# Patient Record
Sex: Female | Born: 1940 | Race: White | Hispanic: No | Marital: Married | Smoking: Never smoker
Health system: Southern US, Community
[De-identification: ages and names within clinical notes are randomized; demographics above are authoritative.]

## PROBLEM LIST (undated history)

## (undated) DIAGNOSIS — E119 Type 2 diabetes mellitus without complications: Secondary | ICD-10-CM

## (undated) HISTORY — PX: OTHER SURGICAL HISTORY: SHX169

## (undated) HISTORY — PX: ABDOMINAL SURGERY: SHX537

## (undated) HISTORY — PX: ABDOMINAL HYSTERECTOMY: SHX81

## (undated) HISTORY — PX: TONSILLECTOMY: SUR1361

---

## 2001-01-19 ENCOUNTER — Other Ambulatory Visit: Admission: RE | Admit: 2001-01-19 | Discharge: 2001-01-19 | Payer: Self-pay | Admitting: Internal Medicine

## 2002-01-22 ENCOUNTER — Encounter: Payer: Self-pay | Admitting: Internal Medicine

## 2002-01-22 ENCOUNTER — Ambulatory Visit (HOSPITAL_COMMUNITY): Admission: RE | Admit: 2002-01-22 | Discharge: 2002-01-22 | Payer: Self-pay | Admitting: Internal Medicine

## 2003-04-08 ENCOUNTER — Encounter: Payer: Self-pay | Admitting: Internal Medicine

## 2003-04-08 ENCOUNTER — Ambulatory Visit (HOSPITAL_COMMUNITY): Admission: RE | Admit: 2003-04-08 | Discharge: 2003-04-08 | Payer: Self-pay | Admitting: Internal Medicine

## 2004-03-30 ENCOUNTER — Other Ambulatory Visit: Admission: RE | Admit: 2004-03-30 | Discharge: 2004-03-30 | Payer: Self-pay | Admitting: Internal Medicine

## 2004-05-17 ENCOUNTER — Ambulatory Visit (HOSPITAL_COMMUNITY): Admission: RE | Admit: 2004-05-17 | Discharge: 2004-05-17 | Payer: Self-pay | Admitting: Internal Medicine

## 2005-06-13 ENCOUNTER — Ambulatory Visit (HOSPITAL_COMMUNITY): Admission: RE | Admit: 2005-06-13 | Discharge: 2005-06-13 | Payer: Self-pay | Admitting: Internal Medicine

## 2006-07-14 ENCOUNTER — Ambulatory Visit (HOSPITAL_COMMUNITY): Admission: RE | Admit: 2006-07-14 | Discharge: 2006-07-14 | Payer: Self-pay | Admitting: Internal Medicine

## 2007-07-20 ENCOUNTER — Ambulatory Visit (HOSPITAL_COMMUNITY): Admission: RE | Admit: 2007-07-20 | Discharge: 2007-07-20 | Payer: Self-pay | Admitting: Internal Medicine

## 2008-09-13 ENCOUNTER — Ambulatory Visit (HOSPITAL_COMMUNITY): Admission: RE | Admit: 2008-09-13 | Discharge: 2008-09-13 | Payer: Self-pay | Admitting: Internal Medicine

## 2009-08-19 ENCOUNTER — Emergency Department (HOSPITAL_COMMUNITY): Admission: EM | Admit: 2009-08-19 | Discharge: 2009-08-19 | Payer: Self-pay | Admitting: Emergency Medicine

## 2009-09-22 ENCOUNTER — Ambulatory Visit (HOSPITAL_COMMUNITY): Admission: RE | Admit: 2009-09-22 | Discharge: 2009-09-22 | Payer: Self-pay | Admitting: Internal Medicine

## 2010-10-01 ENCOUNTER — Other Ambulatory Visit (HOSPITAL_COMMUNITY): Payer: Self-pay | Admitting: Nurse Practitioner

## 2010-10-01 DIAGNOSIS — Z139 Encounter for screening, unspecified: Secondary | ICD-10-CM

## 2010-10-04 ENCOUNTER — Ambulatory Visit (HOSPITAL_COMMUNITY)
Admission: RE | Admit: 2010-10-04 | Discharge: 2010-10-04 | Disposition: A | Discharge status: Home / Self Care | Payer: BC Managed Care – PPO | Source: Ambulatory Visit | Attending: Nurse Practitioner | Admitting: Nurse Practitioner

## 2010-10-04 DIAGNOSIS — Z139 Encounter for screening, unspecified: Secondary | ICD-10-CM

## 2010-10-04 DIAGNOSIS — Z1231 Encounter for screening mammogram for malignant neoplasm of breast: Secondary | ICD-10-CM | POA: Insufficient documentation

## 2010-10-10 ENCOUNTER — Other Ambulatory Visit: Payer: Self-pay | Admitting: Nurse Practitioner

## 2010-10-10 DIAGNOSIS — R928 Other abnormal and inconclusive findings on diagnostic imaging of breast: Secondary | ICD-10-CM

## 2010-10-14 LAB — BASIC METABOLIC PANEL
BUN: 12 mg/dL (ref 6–23)
CO2: 29 mEq/L (ref 19–32)
Calcium: 9.2 mg/dL (ref 8.4–10.5)
Chloride: 101 mEq/L (ref 96–112)
Creatinine, Ser: 0.59 mg/dL (ref 0.4–1.2)
GFR calc Af Amer: >60 mL/min (ref >60)
GFR calc non Af Amer: >60 mL/min (ref >60)
Glucose, Bld: 207 mg/dL — ABNORMAL HIGH (ref 70–99)
Potassium: 3.6 mEq/L (ref 3.5–5.1)
Sodium: 136 mEq/L (ref 135–145)

## 2010-10-14 LAB — POCT CARDIAC MARKERS
CKMB, poc: <1 ng/mL — ABNORMAL LOW (ref 1.0–8.0)
CKMB, poc: <1 ng/mL — ABNORMAL LOW (ref 1.0–8.0)
Myoglobin, poc: 41.8 ng/mL (ref 12–200)
Myoglobin, poc: 56.6 ng/mL (ref 12–200)
Troponin i, poc: <0.05 ng/mL (ref 0.00–0.09)
Troponin i, poc: <0.05 ng/mL (ref 0.00–0.09)

## 2010-10-14 LAB — CBC
HCT: 40.7 % (ref 36.0–46.0)
Hemoglobin: 13.7 g/dL (ref 12.0–15.0)
MCHC: 33.7 g/dL (ref 30.0–36.0)
MCV: 88.8 fL (ref 78.0–100.0)
Platelets: 237 10*3/uL (ref 150–400)
RBC: 4.58 MIL/uL (ref 3.87–5.11)
RDW: 13.5 % (ref 11.5–15.5)
WBC: 12.4 10*3/uL — ABNORMAL HIGH (ref 4.0–10.5)

## 2010-10-14 LAB — DIFFERENTIAL
Basophils Absolute: 0 10*3/uL (ref 0.0–0.1)
Basophils Relative: 0 % (ref 0–1)
Eosinophils Absolute: 0.1 10*3/uL (ref 0.0–0.7)
Eosinophils Relative: 0 % (ref 0–5)
Lymphocytes Relative: 9 % — ABNORMAL LOW (ref 12–46)
Lymphs Abs: 1.1 10*3/uL (ref 0.7–4.0)
Monocytes Absolute: 1.3 10*3/uL — ABNORMAL HIGH (ref 0.1–1.0)
Monocytes Relative: 10 % (ref 3–12)
Neutro Abs: 9.9 10*3/uL — ABNORMAL HIGH (ref 1.7–7.7)
Neutrophils Relative %: 80 % — ABNORMAL HIGH (ref 43–77)

## 2010-10-14 LAB — D-DIMER, QUANTITATIVE: D-Dimer, Quant: 0.45 ug/mL-FEU (ref 0.00–0.48)

## 2010-10-31 ENCOUNTER — Other Ambulatory Visit: Payer: Self-pay | Admitting: Nurse Practitioner

## 2010-10-31 ENCOUNTER — Ambulatory Visit (HOSPITAL_COMMUNITY)
Admission: RE | Admit: 2010-10-31 | Discharge: 2010-10-31 | Disposition: A | Discharge status: Home / Self Care | Payer: BC Managed Care – PPO | Source: Ambulatory Visit | Attending: Nurse Practitioner | Admitting: Nurse Practitioner

## 2010-10-31 DIAGNOSIS — R928 Other abnormal and inconclusive findings on diagnostic imaging of breast: Secondary | ICD-10-CM

## 2010-10-31 DIAGNOSIS — N63 Unspecified lump in unspecified breast: Secondary | ICD-10-CM | POA: Insufficient documentation

## 2010-10-31 DIAGNOSIS — N6009 Solitary cyst of unspecified breast: Secondary | ICD-10-CM | POA: Insufficient documentation

## 2011-07-20 ENCOUNTER — Emergency Department (HOSPITAL_COMMUNITY): Payer: BC Managed Care – PPO

## 2011-07-20 ENCOUNTER — Emergency Department (HOSPITAL_COMMUNITY)
Admission: EM | Admit: 2011-07-20 | Discharge: 2011-07-20 | Disposition: A | Discharge status: Home / Self Care | Payer: BC Managed Care – PPO | Attending: Emergency Medicine | Admitting: Emergency Medicine

## 2011-07-20 ENCOUNTER — Encounter: Payer: Self-pay | Admitting: *Deleted

## 2011-07-20 DIAGNOSIS — D72829 Elevated white blood cell count, unspecified: Secondary | ICD-10-CM

## 2011-07-20 DIAGNOSIS — Z9079 Acquired absence of other genital organ(s): Secondary | ICD-10-CM | POA: Insufficient documentation

## 2011-07-20 DIAGNOSIS — J189 Pneumonia, unspecified organism: Secondary | ICD-10-CM | POA: Insufficient documentation

## 2011-07-20 DIAGNOSIS — R7309 Other abnormal glucose: Secondary | ICD-10-CM | POA: Insufficient documentation

## 2011-07-20 LAB — BASIC METABOLIC PANEL
BUN: 11 mg/dL (ref 6–23)
CO2: 27 mEq/L (ref 19–32)
Calcium: 9.9 mg/dL (ref 8.4–10.5)
Chloride: 97 mEq/L (ref 96–112)
Creatinine, Ser: 0.42 mg/dL — ABNORMAL LOW (ref 0.50–1.10)
GFR calc Af Amer: >90 mL/min (ref >90)
GFR calc non Af Amer: >90 mL/min (ref >90)
Glucose, Bld: 129 mg/dL — ABNORMAL HIGH (ref 70–99)
Potassium: 3.9 mEq/L (ref 3.5–5.1)
Sodium: 134 mEq/L — ABNORMAL LOW (ref 135–145)

## 2011-07-20 LAB — DIFFERENTIAL
Basophils Absolute: 0 10*3/uL (ref 0.0–0.1)
Basophils Relative: 0 % (ref 0–1)
Eosinophils Absolute: 0.1 10*3/uL (ref 0.0–0.7)
Eosinophils Relative: 0 % (ref 0–5)
Lymphocytes Relative: 7 % — ABNORMAL LOW (ref 12–46)
Lymphs Abs: 1.2 10*3/uL (ref 0.7–4.0)
Monocytes Absolute: 1.8 10*3/uL — ABNORMAL HIGH (ref 0.1–1.0)
Monocytes Relative: 10 % (ref 3–12)
Neutro Abs: 13.8 10*3/uL — ABNORMAL HIGH (ref 1.7–7.7)
Neutrophils Relative %: 82 % — ABNORMAL HIGH (ref 43–77)

## 2011-07-20 LAB — CBC
HCT: 36.2 % (ref 36.0–46.0)
Hemoglobin: 11.9 g/dL — ABNORMAL LOW (ref 12.0–15.0)
MCH: 29 pg (ref 26.0–34.0)
MCHC: 32.9 g/dL (ref 30.0–36.0)
MCV: 88.1 fL (ref 78.0–100.0)
Platelets: 381 10*3/uL (ref 150–400)
RBC: 4.11 MIL/uL (ref 3.87–5.11)
RDW: 12.9 % (ref 11.5–15.5)
WBC: 16.8 10*3/uL — ABNORMAL HIGH (ref 4.0–10.5)

## 2011-07-20 MED ORDER — BENZONATATE 100 MG PO CAPS: 100.0000 mg | ORAL_CAPSULE | Freq: Three times a day (TID) | ORAL | Status: AC

## 2011-07-20 MED ORDER — SODIUM CHLORIDE 0.9 % IV SOLN
INTRAVENOUS | Status: DC
  Administered 2011-07-20: 08:00:00 via INTRAVENOUS

## 2011-07-20 MED ORDER — AZITHROMYCIN 250 MG PO TABS: ORAL_TABLET | ORAL | Status: DC

## 2011-07-20 MED ORDER — AZITHROMYCIN 250 MG PO TABS
500.0000 mg | ORAL_TABLET | Freq: Once | ORAL | Status: AC
  Administered 2011-07-20: 500 mg via ORAL
  Filled 2011-07-20: qty 2

## 2011-07-20 NOTE — ED Provider Notes (Signed)
History   This chart was scribed for Nicholes Stairs, MD by Sofie Rower. The patient was seen in room APA07/APA07 and the patient's care was started at 8:00 a.m.    CSN: [id number]  Arrival date & time 07/20/11  7829   First MD Initiated Contact with Patient 07/20/11 [phone]      Chief Complaint  Patient presents with  . Cough    (Consider location/radiation/quality/duration/timing/severity/associated sxs/prior treatment) HPI  Christine Mcfarland is a 70 y.o. female who presents to the Emergency Department complaining of moderate, constant chest pain onset three weeks ago with associated symptoms of intermittent chills, sweats, loss of energy, neck pain, back pain, productive yellow cough. Pt denies fever, nausea, vomiting, diarrhea. Pt. Has a history of diabetes. Pt PCP is Dr. Rico Ala in [place].   History reviewed. No pertinent past medical history.  Past Surgical History  Procedure Date  . Tonsillectomy   . Bilateral tubal ligagtion   . Abdominal hysterectomy   . Abdominal surgery     History reviewed. No pertinent family history.  History  Substance Use Topics  . Smoking status: Never Smoker   . Smokeless tobacco: Not on file  . Alcohol Use: No    OB History    Grav Para Term Preterm Abortions TAB SAB Ect Mult Living                  Review of Systems  10 Systems reviewed and are negative for acute change except as noted in the HPI.   Allergies  Sulfa antibiotics  Home Medications  No current outpatient prescriptions on file.  BP 121/59  Pulse 106  Temp(Src) 98.1 F (36.7 C) (Oral)  Resp 18  Ht 5\' 1"  (1.549 m)  Wt 110 lb (49.896 kg)  BMI 20.78 kg/m2  SpO2 96%  Physical Exam  Nursing note and vitals reviewed. Constitutional: She is oriented to person, place, and time. She appears well-developed and well-nourished. No distress.  HENT:  Head: Normocephalic and atraumatic.  Mouth/Throat: Oropharynx is clear and moist.  Eyes: EOM are normal. Pupils  are equal, round, and reactive to light.  Neck: Neck supple. No tracheal deviation present.  Cardiovascular: Normal rate and regular rhythm.   No murmur heard. Pulmonary/Chest: Effort normal and breath sounds normal. No respiratory distress. She has no wheezes.  Abdominal: Soft. She exhibits no distension.  Musculoskeletal: Normal range of motion. She exhibits no edema.  Neurological: She is alert and oriented to person, place, and time. No sensory deficit.  Skin: Skin is warm and dry.  Psychiatric: She has a normal mood and affect. Her behavior is normal.    ED Course  Procedures (including critical care time) The patient is a 70 year old, female, with borderline diabetes, does not smoke.  She presents to emergency room [facility] with a 3 weeks of symptoms consisting of chest congestion, productive cough with yellow sputum, intermittent, chills and sweating, but no fever.  Her physical examination is normal.  We will perform a chest x-ray, and laboratory testing, to try to determine whether she has a pneumonia or other etiology for her productive cough, and weakness.  DIAGNOSTIC STUDIES: Oxygen Saturation is 96% on room air, adequate by my interpretation.    COORDINATION OF CARE:  Results for orders placed during the hospital encounter of 07/20/11  CBC      Component Value Range   WBC 16.8 (*) 4.0 - 10.5 (K/uL)   RBC 4.11  3.87 - 5.11 (MIL/uL)   Hemoglobin 11.9 (*)  12.0 - 15.0 (g/dL)   HCT 40.9  81.1 - 91.4 (%)   MCV 88.1  78.0 - 100.0 (fL)   MCH 29.0  26.0 - 34.0 (pg)   MCHC 32.9  30.0 - 36.0 (g/dL)   RDW 78.2  95.6 - 21.3 (%)   Platelets 381  150 - 400 (K/uL)  DIFFERENTIAL      Component Value Range   Neutrophils Relative 82 (*) 43 - 77 (%)   Neutro Abs 13.8 (*) 1.7 - 7.7 (K/uL)   Lymphocytes Relative 7 (*) 12 - 46 (%)   Lymphs Abs 1.2  0.7 - 4.0 (K/uL)   Monocytes Relative 10  3 - 12 (%)   Monocytes Absolute 1.8 (*) 0.1 - 1.0 (K/uL)   Eosinophils Relative 0  0 - 5 (%)    Eosinophils Absolute 0.1  0.0 - 0.7 (K/uL)   Basophils Relative 0  0 - 1 (%)   Basophils Absolute 0.0  0.0 - 0.1 (K/uL)  BASIC METABOLIC PANEL      Component Value Range   Sodium 134 (*) 135 - 145 (mEq/L)   Potassium 3.9  3.5 - 5.1 (mEq/L)   Chloride 97  96 - 112 (mEq/L)   CO2 27  19 - 32 (mEq/L)   Glucose, Bld 129 (*) 70 - 99 (mg/dL)   BUN 11  6 - 23 (mg/dL)   Creatinine, Ser 0.86 (*) 0.50 - 1.10 (mg/dL)   Calcium 9.9  8.4 - 57.8 (mg/dL)   GFR calc non Af Amer >90  >90 (mL/min)   GFR calc Af Amer >90  >90 (mL/min)   Dg Chest 2 View  07/20/2011  *RADIOLOGY REPORT*  Clinical Data: Cough and weakness  CHEST - 2 VIEW  Comparison: 08/19/2009  Findings: Mild hyperinflation.  Patchy subsegmental atelectasis versus developing infiltrates in the left lower lobe.  Right lung clear.  Heart size normal.  No effusion.  Regional bones unremarkable.  IMPRESSION:  1.  Patchy left lower lobe atelectasis or developing infiltrates.  Original Report Authenticated By: Osa Craver, M.D.       MDM  CAP No respiratory distress.  No toxicity.   She has community-acquired pneumonia, but her comorbidities.  Only include borderline diabetes mellitus.  She has no other significant comorbidities.  She does not smoke and does not use oxygen at home.  Therefore, her pneumonia.  Severity score is less than 70, and she can be treated as an outpatient.  8:02AM- EDP at bedside discusses treatment plan. 8:36AM- Recheck. EDP at bedside discusses treatment plan.    I personally performed the services described in this documentation, which was scribed in my presence. The recorded information has been reviewed and considered.    Nicholes Stairs, MD 07/20/11 [phone]

## 2011-07-20 NOTE — ED Notes (Signed)
Pt c/o cough, congestion and feeling bad x 3 weeks. States that she is coughing up yellow phlegm. Denies fever.

## 2011-09-10 IMAGING — US US BREAST*R*
1 series · 4 of 4 positions shown · non-contrast
Comparison: Mammograms 10/04/2010

CLINICAL DATA: Possible mass right breast identified on one-view
recent screening mammogram.

DIGITAL DIAGNOSTIC RIGHT MAMMOGRAM WITHOUT CAD AND RIGHT BREAST
ULTRASOUND:

[Series 1: us breast*right* · 0.07mm/px · 4 of 4 slices shown]
[im 1/4]
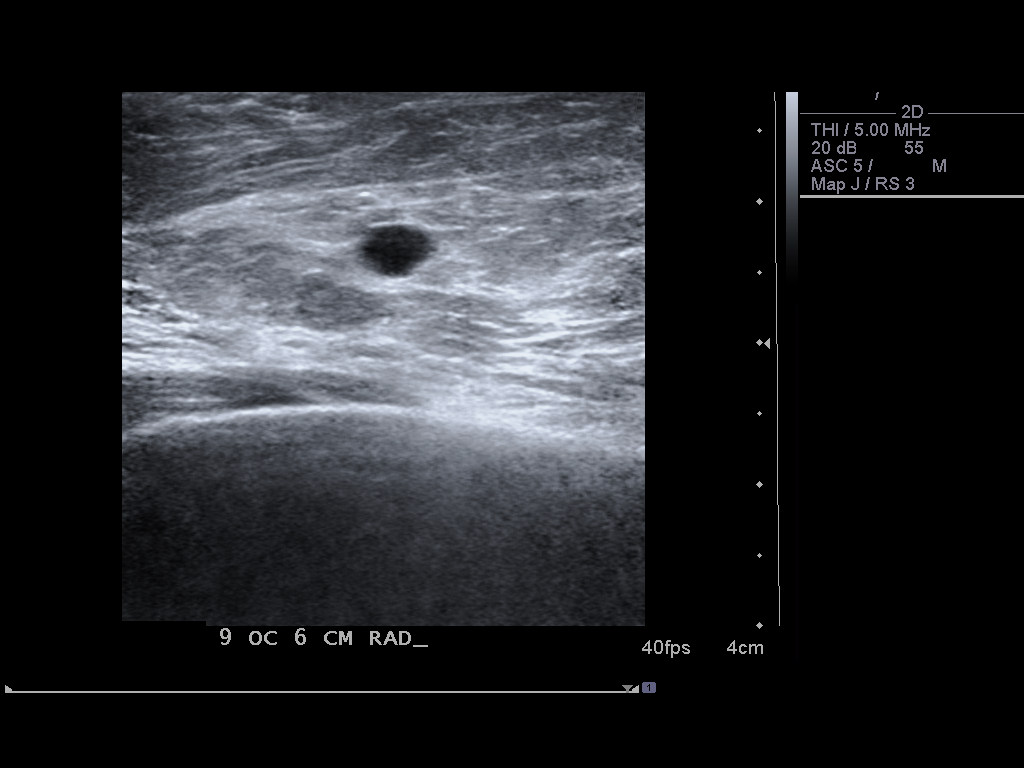
[im 2/4]
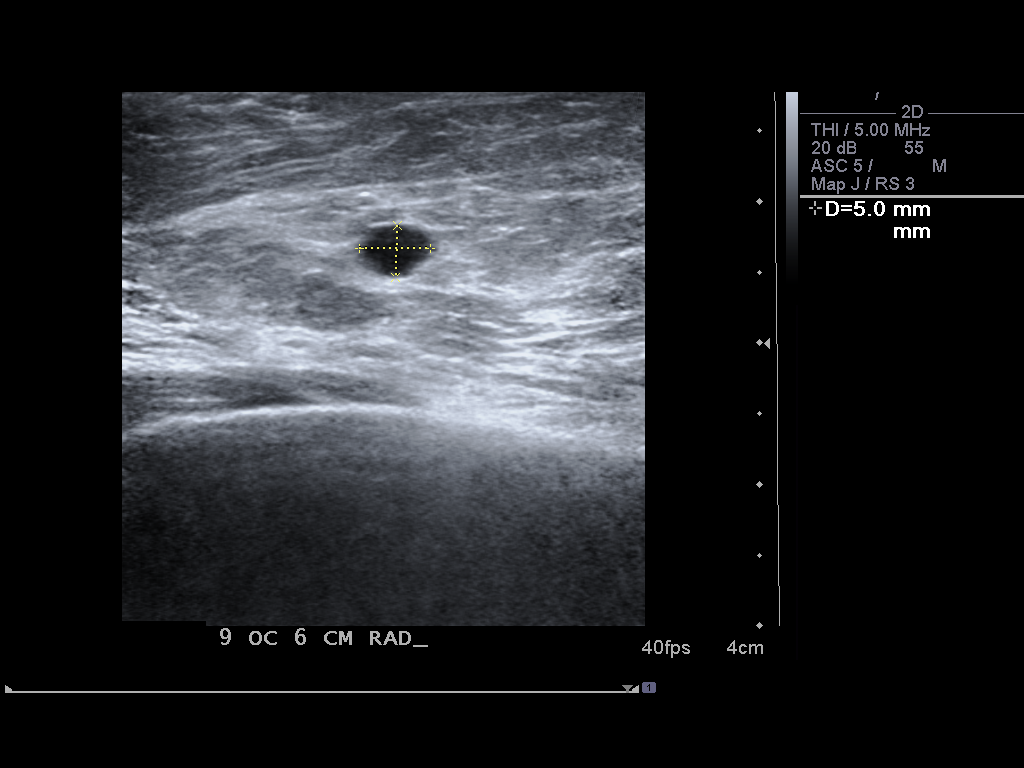
[im 3/4]
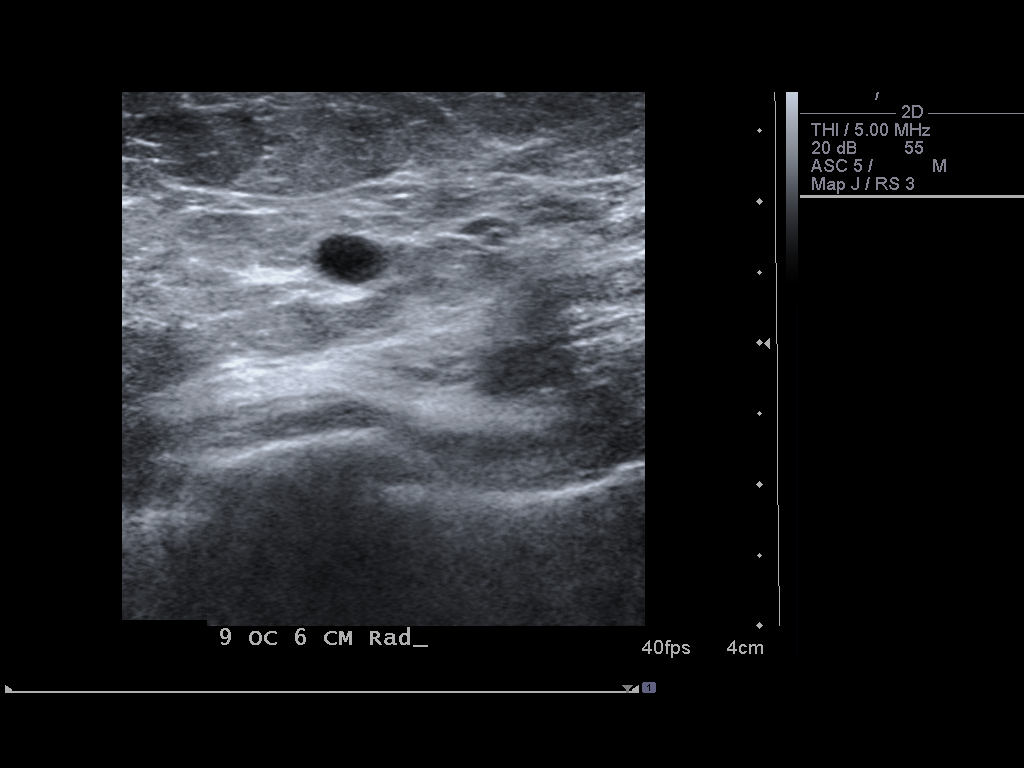
[im 4/4]
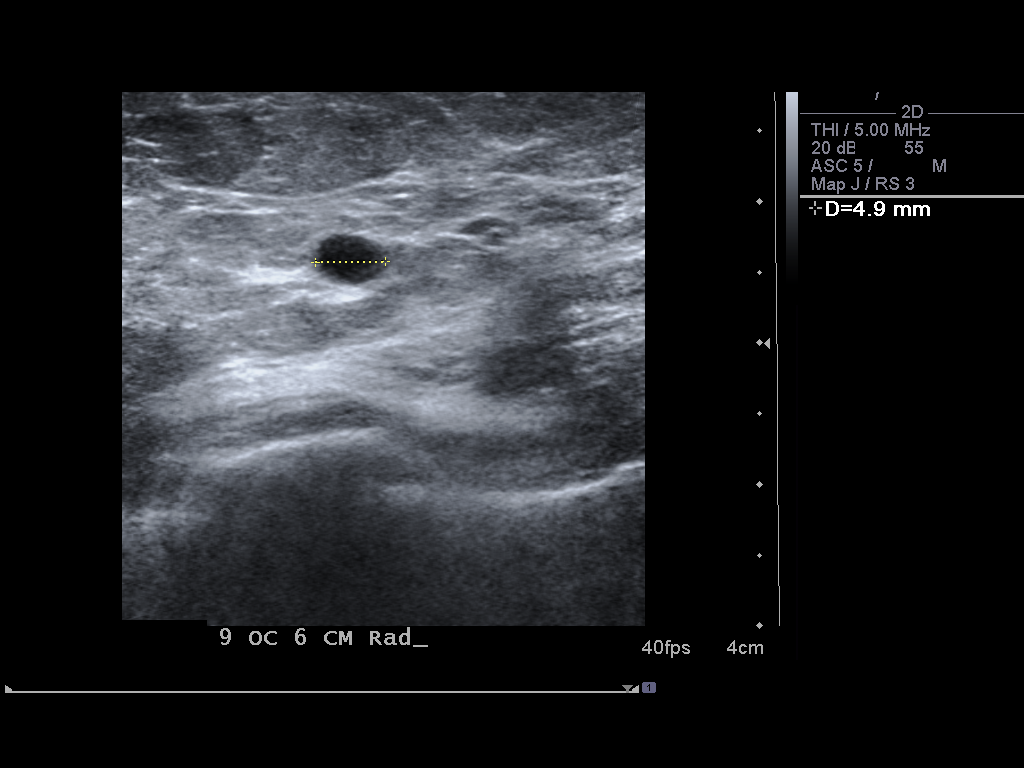

[4 of 4 positions shown; findings below may reference images not displayed]

FINDINGS: A focal spot compression view of the outer right breast
confirms a circumscribed low density sub-centimeter nodule.  On a
90 degrees lateral view of the right breast, this nodule is not
definitely visualized.  There are no suspicious findings on the 90
degrees lateral view.

On physical exam, no mass is palpated in the outer right breast.

Ultrasound is performed, showing a 5 x 5 x 4 mm simple cyst 9
o'clock position 6 cm from the nipple.  This corresponds in
expected size and shape to the nodular density seen on the CC view
of the mammogram.  No solid mass is identified in the outer right
breast.
IMPRESSION: No evidence of malignancy in the right breast.  5 mm simple cyst 9
o'clock position.  Bilateral screening mammogram in 1 year is
recommended.

BI-RADS CATEGORY 2:  Benign finding(s).

## 2011-10-30 ENCOUNTER — Other Ambulatory Visit (HOSPITAL_COMMUNITY): Payer: Self-pay | Admitting: Nurse Practitioner

## 2011-10-30 DIAGNOSIS — Z139 Encounter for screening, unspecified: Secondary | ICD-10-CM

## 2011-11-01 ENCOUNTER — Ambulatory Visit (HOSPITAL_COMMUNITY)
Admission: RE | Admit: 2011-11-01 | Discharge: 2011-11-01 | Disposition: A | Discharge status: Home / Self Care | Payer: BC Managed Care – PPO | Source: Ambulatory Visit | Attending: Nurse Practitioner | Admitting: Nurse Practitioner

## 2011-11-01 DIAGNOSIS — N6459 Other signs and symptoms in breast: Secondary | ICD-10-CM | POA: Insufficient documentation

## 2011-11-01 DIAGNOSIS — Z1231 Encounter for screening mammogram for malignant neoplasm of breast: Secondary | ICD-10-CM | POA: Insufficient documentation

## 2011-11-01 DIAGNOSIS — Z139 Encounter for screening, unspecified: Secondary | ICD-10-CM

## 2011-11-06 ENCOUNTER — Other Ambulatory Visit: Payer: Self-pay | Admitting: Nurse Practitioner

## 2011-11-06 DIAGNOSIS — R928 Other abnormal and inconclusive findings on diagnostic imaging of breast: Secondary | ICD-10-CM

## 2011-11-14 ENCOUNTER — Ambulatory Visit
Admission: RE | Admit: 2011-11-14 | Discharge: 2011-11-14 | Disposition: A | Discharge status: Home / Self Care | Payer: BC Managed Care – PPO | Source: Ambulatory Visit | Attending: Nurse Practitioner | Admitting: Nurse Practitioner

## 2011-11-14 DIAGNOSIS — R928 Other abnormal and inconclusive findings on diagnostic imaging of breast: Secondary | ICD-10-CM

## 2012-05-11 ENCOUNTER — Other Ambulatory Visit (HOSPITAL_COMMUNITY): Payer: Self-pay | Admitting: Nurse Practitioner

## 2012-05-11 DIAGNOSIS — Z09 Encounter for follow-up examination after completed treatment for conditions other than malignant neoplasm: Secondary | ICD-10-CM

## 2012-05-27 ENCOUNTER — Ambulatory Visit (HOSPITAL_COMMUNITY)
Admission: RE | Admit: 2012-05-27 | Discharge: 2012-05-27 | Disposition: A | Discharge status: Home / Self Care | Payer: BC Managed Care – PPO | Source: Ambulatory Visit | Attending: Nurse Practitioner | Admitting: Nurse Practitioner

## 2012-05-27 DIAGNOSIS — N6009 Solitary cyst of unspecified breast: Secondary | ICD-10-CM | POA: Insufficient documentation

## 2012-05-27 DIAGNOSIS — Z09 Encounter for follow-up examination after completed treatment for conditions other than malignant neoplasm: Secondary | ICD-10-CM | POA: Insufficient documentation

## 2012-11-02 ENCOUNTER — Other Ambulatory Visit (HOSPITAL_COMMUNITY): Payer: Self-pay | Admitting: Nurse Practitioner

## 2012-11-02 DIAGNOSIS — Z09 Encounter for follow-up examination after completed treatment for conditions other than malignant neoplasm: Secondary | ICD-10-CM

## 2012-11-17 ENCOUNTER — Ambulatory Visit (HOSPITAL_COMMUNITY)
Admission: RE | Admit: 2012-11-17 | Discharge: 2012-11-17 | Disposition: A | Discharge status: Home / Self Care | Payer: BC Managed Care – PPO | Source: Ambulatory Visit | Attending: Nurse Practitioner | Admitting: Nurse Practitioner

## 2012-11-17 DIAGNOSIS — Z1231 Encounter for screening mammogram for malignant neoplasm of breast: Secondary | ICD-10-CM | POA: Insufficient documentation

## 2012-11-17 DIAGNOSIS — Z09 Encounter for follow-up examination after completed treatment for conditions other than malignant neoplasm: Secondary | ICD-10-CM

## 2013-04-06 IMAGING — US US BREAST*L*
1 series · 4 of 4 positions shown · non-contrast
Comparison: 11/14/2011

On physical exam, no mass is palpated in the left breast.

CLINICAL DATA: The patient returns for short interval follow-up of
clustered cysts in the left upper inner quadrant noted on prior
study dated 11/14/2011.

LEFT BREAST ULTRASOUND

[Series 1: us breast*left* · 0.08mm/px · 4 of 4 slices shown]
[im 1/4]
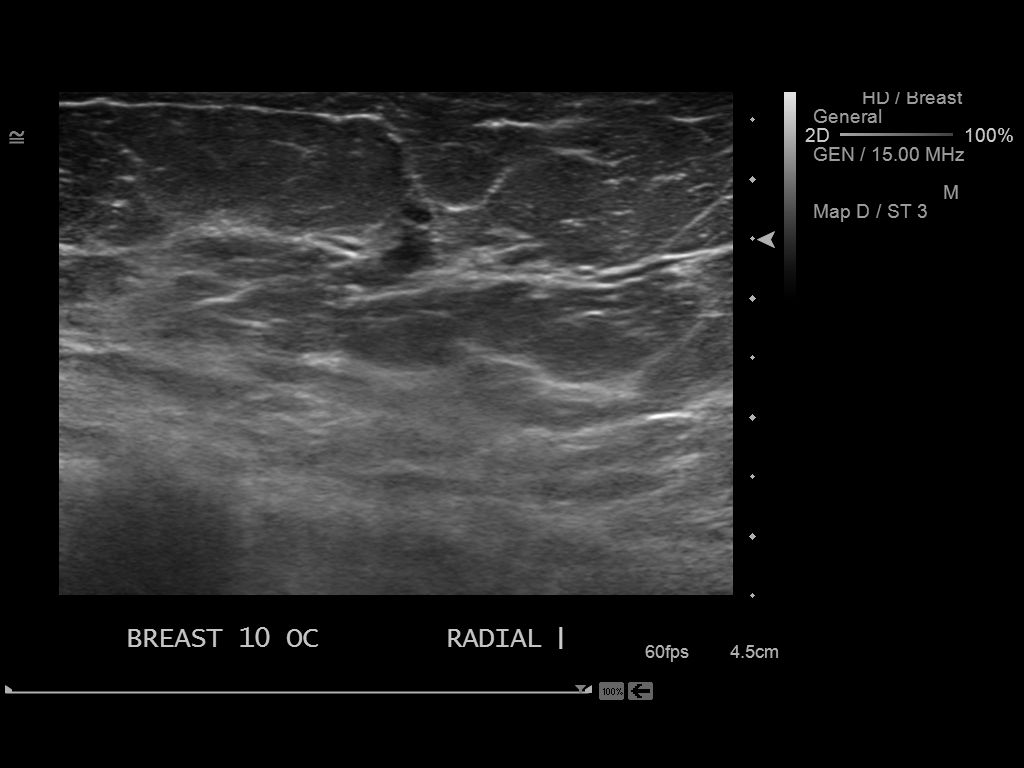
[im 2/4]
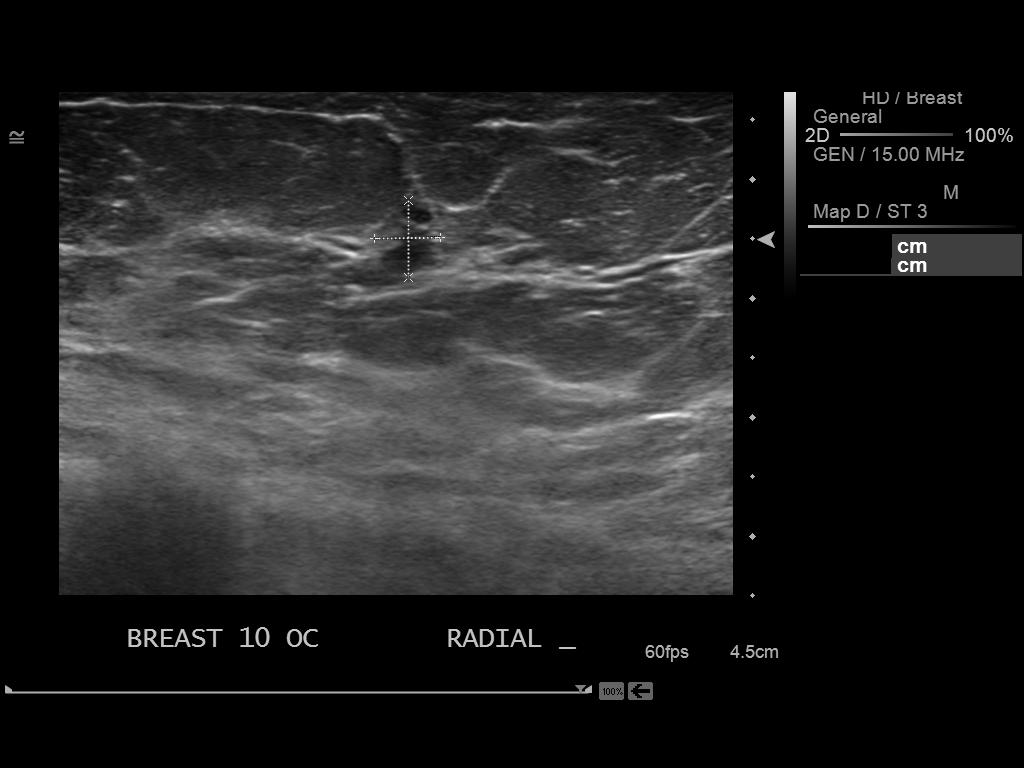
[im 3/4]
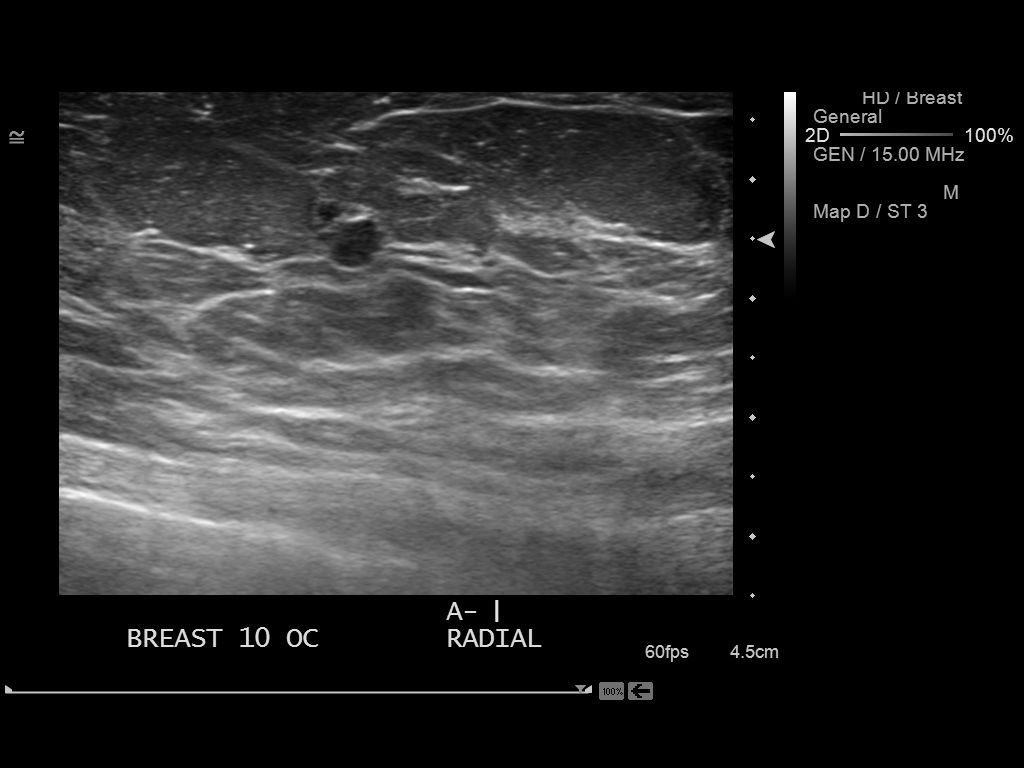
[im 4/4]
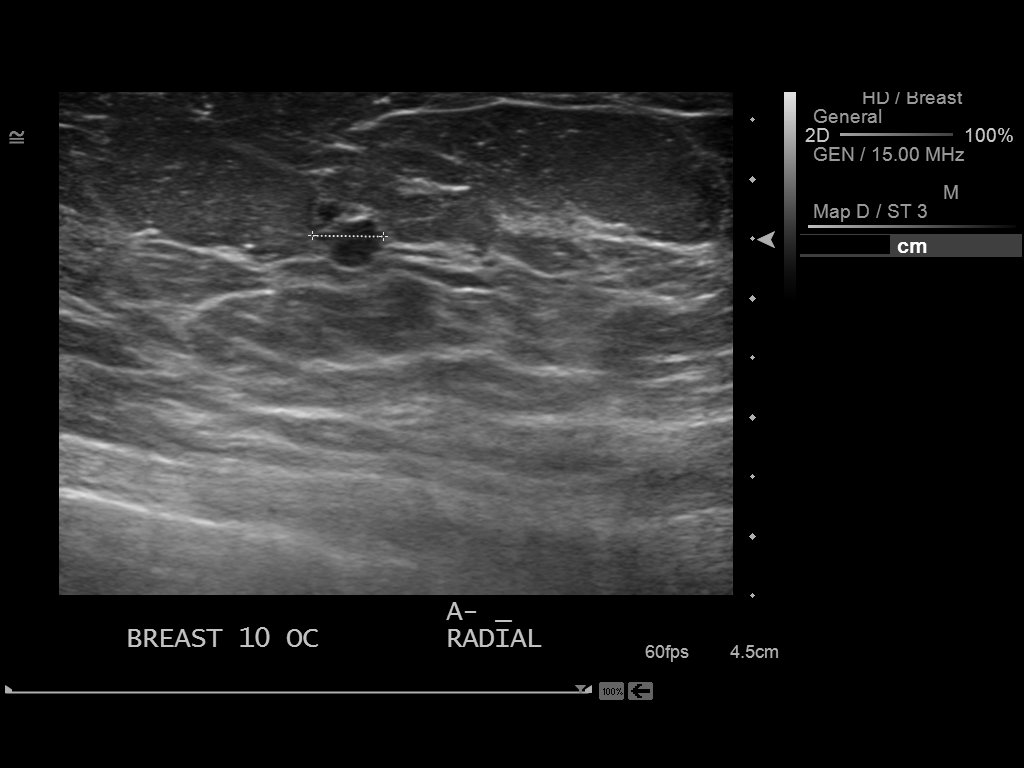

[4 of 4 positions shown; findings below may reference images not displayed]

FINDINGS: Ultrasound is performed, showing clustered cysts at 10
o'clock 4 cm from the left nipple with an overall measurement of 6
x 7 x 6 mm.
IMPRESSION: Benign clustered cysts in the left upper inner quadrant.

RECOMMENDATION:
Yearly screening mammography is suggested with next scheduled exam
in October 2012.

I have discussed the findings and recommendations with the patient.
Results were also provided in writing at the conclusion of the
visit.

BI-RADS CATEGORY 2:  Benign finding(s).

## 2013-10-19 ENCOUNTER — Other Ambulatory Visit (HOSPITAL_COMMUNITY): Payer: Self-pay | Admitting: Nurse Practitioner

## 2013-10-19 DIAGNOSIS — Z1231 Encounter for screening mammogram for malignant neoplasm of breast: Secondary | ICD-10-CM

## 2013-11-18 ENCOUNTER — Ambulatory Visit (HOSPITAL_COMMUNITY)
Admission: RE | Admit: 2013-11-18 | Discharge: 2013-11-18 | Disposition: A | Discharge status: Home / Self Care | Payer: Medicare Other | Source: Ambulatory Visit | Attending: Nurse Practitioner | Admitting: Nurse Practitioner

## 2013-11-18 DIAGNOSIS — Z1231 Encounter for screening mammogram for malignant neoplasm of breast: Secondary | ICD-10-CM | POA: Insufficient documentation

## 2015-08-02 ENCOUNTER — Other Ambulatory Visit (HOSPITAL_COMMUNITY): Payer: Self-pay | Admitting: Nurse Practitioner

## 2015-08-02 DIAGNOSIS — Z1231 Encounter for screening mammogram for malignant neoplasm of breast: Secondary | ICD-10-CM

## 2015-08-09 ENCOUNTER — Ambulatory Visit (HOSPITAL_COMMUNITY)
Admission: RE | Admit: 2015-08-09 | Discharge: 2015-08-09 | Disposition: A | Discharge status: Home / Self Care | Payer: Medicare Other | Source: Ambulatory Visit | Attending: Nurse Practitioner | Admitting: Nurse Practitioner

## 2015-08-09 DIAGNOSIS — Z1231 Encounter for screening mammogram for malignant neoplasm of breast: Secondary | ICD-10-CM | POA: Diagnosis present

## 2016-10-23 ENCOUNTER — Other Ambulatory Visit (HOSPITAL_COMMUNITY): Payer: Self-pay | Admitting: Nurse Practitioner

## 2016-10-23 DIAGNOSIS — Z1231 Encounter for screening mammogram for malignant neoplasm of breast: Secondary | ICD-10-CM

## 2016-11-06 ENCOUNTER — Ambulatory Visit (HOSPITAL_COMMUNITY)
Admission: RE | Admit: 2016-11-06 | Discharge: 2016-11-06 | Disposition: A | Discharge status: Home / Self Care | Payer: Medicare Other | Source: Ambulatory Visit | Attending: Nurse Practitioner | Admitting: Nurse Practitioner

## 2016-11-06 DIAGNOSIS — Z1231 Encounter for screening mammogram for malignant neoplasm of breast: Secondary | ICD-10-CM | POA: Diagnosis not present

## 2017-10-21 ENCOUNTER — Other Ambulatory Visit (HOSPITAL_COMMUNITY): Payer: Self-pay | Admitting: Family Medicine

## 2017-10-21 DIAGNOSIS — Z1231 Encounter for screening mammogram for malignant neoplasm of breast: Secondary | ICD-10-CM

## 2017-11-10 ENCOUNTER — Encounter (HOSPITAL_COMMUNITY): Payer: Self-pay

## 2017-11-10 ENCOUNTER — Ambulatory Visit (HOSPITAL_COMMUNITY)
Admission: RE | Admit: 2017-11-10 | Discharge: 2017-11-10 | Disposition: A | Discharge status: Home / Self Care | Payer: Medicare Other | Source: Ambulatory Visit | Attending: Family Medicine | Admitting: Family Medicine

## 2017-11-10 DIAGNOSIS — Z1231 Encounter for screening mammogram for malignant neoplasm of breast: Secondary | ICD-10-CM | POA: Diagnosis not present

## 2018-12-23 ENCOUNTER — Other Ambulatory Visit (HOSPITAL_COMMUNITY): Payer: Self-pay | Admitting: Family Medicine

## 2018-12-23 DIAGNOSIS — Z1231 Encounter for screening mammogram for malignant neoplasm of breast: Secondary | ICD-10-CM

## 2019-01-11 ENCOUNTER — Ambulatory Visit (HOSPITAL_COMMUNITY)
Admission: RE | Admit: 2019-01-11 | Discharge: 2019-01-11 | Disposition: A | Discharge status: Home / Self Care | Payer: Medicare Other | Source: Ambulatory Visit | Attending: Family Medicine | Admitting: Family Medicine

## 2019-01-11 ENCOUNTER — Other Ambulatory Visit: Payer: Self-pay

## 2019-01-11 DIAGNOSIS — Z1231 Encounter for screening mammogram for malignant neoplasm of breast: Secondary | ICD-10-CM | POA: Diagnosis not present

## 2019-12-07 ENCOUNTER — Other Ambulatory Visit (HOSPITAL_COMMUNITY): Payer: Self-pay | Admitting: Family Medicine

## 2019-12-07 DIAGNOSIS — N644 Mastodynia: Secondary | ICD-10-CM

## 2019-12-07 DIAGNOSIS — Z1231 Encounter for screening mammogram for malignant neoplasm of breast: Secondary | ICD-10-CM

## 2020-01-13 ENCOUNTER — Other Ambulatory Visit: Payer: Self-pay

## 2020-01-13 ENCOUNTER — Ambulatory Visit (HOSPITAL_COMMUNITY)
Admission: RE | Admit: 2020-01-13 | Discharge: 2020-01-13 | Disposition: A | Discharge status: Home / Self Care | Payer: Medicare Other | Source: Ambulatory Visit | Attending: Family Medicine | Admitting: Family Medicine

## 2020-01-13 DIAGNOSIS — Z1231 Encounter for screening mammogram for malignant neoplasm of breast: Secondary | ICD-10-CM | POA: Diagnosis not present

## 2020-04-12 ENCOUNTER — Encounter (HOSPITAL_COMMUNITY): Payer: Self-pay

## 2020-04-12 ENCOUNTER — Ambulatory Visit (INDEPENDENT_AMBULATORY_CARE_PROVIDER_SITE_OTHER): Payer: Medicare Other

## 2020-04-12 ENCOUNTER — Emergency Department (HOSPITAL_COMMUNITY)
Admission: EM | Admit: 2020-04-12 | Discharge: 2020-04-12 | Disposition: A | Discharge status: Home / Self Care | Payer: Medicare Other | Attending: Emergency Medicine | Admitting: Emergency Medicine

## 2020-04-12 ENCOUNTER — Other Ambulatory Visit: Payer: Self-pay

## 2020-04-12 ENCOUNTER — Emergency Department (HOSPITAL_COMMUNITY): Payer: Medicare Other

## 2020-04-12 ENCOUNTER — Ambulatory Visit
Admission: EM | Admit: 2020-04-12 | Discharge: 2020-04-12 | Disposition: A | Discharge status: Home / Self Care | Payer: Medicare Other | Source: Home / Self Care

## 2020-04-12 DIAGNOSIS — E119 Type 2 diabetes mellitus without complications: Secondary | ICD-10-CM | POA: Insufficient documentation

## 2020-04-12 DIAGNOSIS — N3000 Acute cystitis without hematuria: Secondary | ICD-10-CM | POA: Insufficient documentation

## 2020-04-12 DIAGNOSIS — R109 Unspecified abdominal pain: Secondary | ICD-10-CM | POA: Diagnosis not present

## 2020-04-12 DIAGNOSIS — R1084 Generalized abdominal pain: Secondary | ICD-10-CM | POA: Insufficient documentation

## 2020-04-12 DIAGNOSIS — R1030 Lower abdominal pain, unspecified: Secondary | ICD-10-CM | POA: Diagnosis not present

## 2020-04-12 HISTORY — DX: Type 2 diabetes mellitus without complications: E11.9

## 2020-04-12 LAB — URINALYSIS, ROUTINE W REFLEX MICROSCOPIC
Bilirubin Urine: NEGATIVE
Glucose, UA: NEGATIVE mg/dL
Hgb urine dipstick: NEGATIVE
Ketones, ur: NEGATIVE mg/dL
Nitrite: NEGATIVE
Protein, ur: NEGATIVE mg/dL
Specific Gravity, Urine: 1.018 (ref 1.005–1.030)
pH: 6 (ref 5.0–8.0)

## 2020-04-12 LAB — COMPREHENSIVE METABOLIC PANEL
ALT: 23 U/L (ref 0–44)
AST: 21 U/L (ref 15–41)
Albumin: 4 g/dL (ref 3.5–5.0)
Alkaline Phosphatase: 51 U/L (ref 38–126)
Anion gap: 9 (ref 5–15)
BUN: 14 mg/dL (ref 8–23)
CO2: 25 mmol/L (ref 22–32)
Calcium: 9 mg/dL (ref 8.9–10.3)
Chloride: 94 mmol/L — ABNORMAL LOW (ref 98–111)
Creatinine, Ser: 0.53 mg/dL (ref 0.44–1.00)
GFR calc Af Amer: >60 mL/min (ref >60)
GFR calc non Af Amer: >60 mL/min (ref >60)
Glucose, Bld: 149 mg/dL — ABNORMAL HIGH (ref 70–99)
Potassium: 4 mmol/L (ref 3.5–5.1)
Sodium: 128 mmol/L — ABNORMAL LOW (ref 135–145)
Total Bilirubin: 0.7 mg/dL (ref 0.3–1.2)
Total Protein: 6.8 g/dL (ref 6.5–8.1)

## 2020-04-12 LAB — POCT URINALYSIS DIP (MANUAL ENTRY)
Glucose, UA: NEGATIVE mg/dL
Nitrite, UA: NEGATIVE
Protein Ur, POC: 30 mg/dL — AB
Spec Grav, UA: 1.015 (ref 1.010–1.025)
Urobilinogen, UA: 0.2 E.U./dL
pH, UA: 8.5 — AB (ref 5.0–8.0)

## 2020-04-12 LAB — CBC
HCT: 38.3 % (ref 36.0–46.0)
Hemoglobin: 12.3 g/dL (ref 12.0–15.0)
MCH: 29.4 pg (ref 26.0–34.0)
MCHC: 32.1 g/dL (ref 30.0–36.0)
MCV: 91.4 fL (ref 80.0–100.0)
Platelets: 301 10*3/uL (ref 150–400)
RBC: 4.19 MIL/uL (ref 3.87–5.11)
RDW: 13.7 % (ref 11.5–15.5)
WBC: 21.2 10*3/uL — ABNORMAL HIGH (ref 4.0–10.5)
nRBC: 0 % (ref 0.0–0.2)

## 2020-04-12 LAB — LIPASE, BLOOD: Lipase: 23 U/L (ref 11–51)

## 2020-04-12 MED ORDER — IOHEXOL 300 MG/ML  SOLN
100.0000 mL | Freq: Once | INTRAMUSCULAR | Status: AC | PRN
  Administered 2020-04-12: 100 mL via INTRAVENOUS

## 2020-04-12 MED ORDER — CEPHALEXIN 500 MG PO CAPS: 500.0000 mg | ORAL_CAPSULE | Freq: Four times a day (QID) | ORAL | 0 refills | Status: AC

## 2020-04-12 MED ORDER — SODIUM CHLORIDE 0.9 % IV SOLN
1.0000 g | Freq: Once | INTRAVENOUS | Status: AC
  Administered 2020-04-12: 1 g via INTRAVENOUS
  Filled 2020-04-12: qty 10

## 2020-04-12 NOTE — Discharge Instructions (Signed)

## 2020-04-12 NOTE — ED Provider Notes (Signed)
Emergency Department Provider Note   I have reviewed the triage vital signs and the nursing notes.   HISTORY  Chief Complaint Abdominal Pain   HPI Christine Mcfarland is a 79 y.o. female with past medical history of diabetes presents emergency department evaluation of lower abdominal pain.  Symptoms began last night and became significantly worse overnight and through the day.  She notes pain with touching especially in the lower abdomen with movement.  She has had some urine hesitancy but denies dysuria or frequency.  No fevers or chills.  No diarrhea or vomiting.  She notes a prior history of hysterectomy and additional abdominal surgery for lysis of adhesions.  She believes that her appendix and gallbladder have been removed but is not completely sure.  Denies any vaginal bleeding or discharge.  She was initially seen in urgent care and referred to the emergency department for further evaluation. No modifying factors.    Past Medical History:  Diagnosis Date  . Diabetes mellitus without complication (HCC)     There are no problems to display for this patient.   Past Surgical History:  Procedure Laterality Date  . ABDOMINAL HYSTERECTOMY    . ABDOMINAL SURGERY    . bilateral tubal ligagtion    . TONSILLECTOMY      Allergies Sulfa antibiotics  History reviewed. No pertinent family history.  Social History Social History   Tobacco Use  . Smoking status: Never Smoker  . Smokeless tobacco: Never Used  Substance Use Topics  . Alcohol use: No  . Drug use: No    Review of Systems  Constitutional: No fever/chills Eyes: No visual changes. ENT: No sore throat. Cardiovascular: Denies chest pain. Respiratory: Denies shortness of breath. Gastrointestinal: Positive abdominal pain.  No nausea, no vomiting.  No diarrhea.  No constipation. Genitourinary: Negative for dysuria. Positive urine hesitancy.  Musculoskeletal: Negative for back pain. Skin: Negative for  rash. Neurological: Negative for headaches, focal weakness or numbness.  10-point ROS otherwise negative.  ____________________________________________   PHYSICAL EXAM:  VITAL SIGNS: ED Triage Vitals  Enc Vitals Group     BP 04/12/20 1550 (!) 124/51     Pulse Rate 04/12/20 1550 81     Resp 04/12/20 1550 16     Temp 04/12/20 1550 98.1 F (36.7 C)     Temp Source 04/12/20 1550 Oral     SpO2 04/12/20 1550 100 %     Weight 04/12/20 1551 120 lb (54.4 kg)     Height 04/12/20 1551 5\' 2"  (1.575 m)   Constitutional: Alert and oriented. Well appearing and in no acute distress. Eyes: Conjunctivae are normal. Head: Atraumatic. Nose: No congestion/rhinnorhea. Mouth/Throat: Mucous membranes are moist.   Neck: No stridor.  Cardiovascular: Normal rate, regular rhythm. Good peripheral circulation. Grossly normal heart sounds.   Respiratory: Normal respiratory effort.  No retractions. Lungs CTAB. Gastrointestinal: Soft with diffuse tenderness to palpation worse in the lower abdomen diffusely. No rebound or guarding. No distention.  Musculoskeletal: No gross deformities of extremities. Neurologic:  Normal speech and language.  Skin:  Skin is warm, dry and intact. No rash noted.   ____________________________________________   LABS (all labs ordered are listed, but only abnormal results are displayed)  Labs Reviewed  COMPREHENSIVE METABOLIC PANEL - Abnormal; Notable for the following components:      Result Value   Sodium 128 (*)    Chloride 94 (*)    Glucose, Bld 149 (*)    All other components within normal limits  CBC - Abnormal; Notable for the following components:   WBC 21.2 (*)    All other components within normal limits  URINALYSIS, ROUTINE W REFLEX MICROSCOPIC - Abnormal; Notable for the following components:   Color, Urine AMBER (*)    APPearance HAZY (*)    Leukocytes,Ua LARGE (*)    Bacteria, UA RARE (*)    All other components within normal limits  LIPASE, BLOOD    ____________________________________________  RADIOLOGY  DG Abd 2 Views  Result Date: 04/12/2020 CLINICAL DATA:  Abdominal pain EXAM: X-RAY ABDOMEN 2 VIEWS COMPARISON:  None. FINDINGS: Supine and upright images were obtained. There is moderate stool throughout the colon. There is no bowel dilatation or air-fluid level to suggest bowel obstruction. No free air. There are surgical clips in the right quadrant. Lung bases are clear. IMPRESSION: No bowel obstruction or free air.  Lung bases clear. Electronically Signed   By: Bretta Bang III M.D.   On: 04/12/2020 14:27    ____________________________________________   PROCEDURES  Procedure(s) performed:   Procedures  None  ____________________________________________   INITIAL IMPRESSION / ASSESSMENT AND PLAN / ED COURSE  Pertinent labs & imaging results that were available during my care of the patient were reviewed by me and considered in my medical decision making (see chart for details).   Patient presents emerged department evaluation abdominal pain.  She has tenderness on exam.  Some suggestion of urine infection on her UA but not enough to explain symptoms and tenderness on exam.  CT imaging obtained and pending.   CT reviewed showing mild cystitis. No other findings. Plan for abx and d/c home. Pain is well controlled here. Vitals are WNL.  ____________________________________________  FINAL CLINICAL IMPRESSION(S) / ED DIAGNOSES  Final diagnoses:  Lower abdominal pain  Acute cystitis without hematuria     MEDICATIONS GIVEN DURING THIS VISIT:  Medications  iohexol (OMNIPAQUE) 300 MG/ML solution 100 mL (100 mLs Intravenous Contrast Given 04/12/20 2212)  cefTRIAXone (ROCEPHIN) 1 g in sodium chloride 0.9 % 100 mL IVPB (0 g Intravenous Stopped 04/12/20 2344)     NEW OUTPATIENT MEDICATIONS STARTED DURING THIS VISIT:  Discharge Medication List as of 04/12/2020 11:02 PM    START taking these medications   Details   cephALEXin (KEFLEX) 500 MG capsule Take 1 capsule (500 mg total) by mouth 4 (four) times daily for 7 days., Starting Wed 04/12/2020, Until Wed 04/19/2020, Normal        Note:  This document was prepared using Dragon voice recognition software and may include unintentional dictation errors.  Alona Bene, MD, [facility]    Ivyonna Hoelzel, Arlyss Repress, MD 04/15/20 [phone]

## 2020-04-12 NOTE — ED Triage Notes (Signed)
Pt reports having severe abdominal pain that began last night. Pt states she had bm last night , denies nausea and vomiting, abdomen is distended

## 2020-04-12 NOTE — ED Provider Notes (Signed)
[facility]   [id number] 04/12/20 Arrival Time: 1250  CC: ABDOMINAL DISCOMFORT  SUBJECTIVE:  Christine Mcfarland is a 79 y.o. female who presents to the urgent care with a complaint of abdominal pain that started last night.  Denies a precipitating event, trauma, close contacts with similar symptoms, recent travel or antibiotic use.  Localizes pain to generalized abdomen describes as intermittent and cramping.  Has no tried any OTC medication denies alleviating or aggravating factors.  Denies similar symptoms in the past.   Denies fever, chills, appetite changes, weight changes, nausea, vomiting, chest pain, SOB, diarrhea, constipation, hematochezia, melena, dysuria, difficulty urinating, increased frequency or urgency, flank pain, loss of bowel or bladder function, vaginal discharge, vaginal odor, vaginal bleeding, dyspareunia, pelvic pain.     No LMP recorded. Patient has had a hysterectomy.  ROS: As per HPI.  All other pertinent ROS negative.     Past Medical History:  Diagnosis Date   Diabetes mellitus without complication (HCC)    Past Surgical History:  Procedure Laterality Date   ABDOMINAL HYSTERECTOMY     ABDOMINAL SURGERY     bilateral tubal ligagtion     TONSILLECTOMY     Allergies  Allergen Reactions   Sulfa Antibiotics    No current facility-administered medications on file prior to encounter.   Current Outpatient Medications on File Prior to Encounter  Medication Sig Dispense Refill   azithromycin (ZITHROMAX) 250 MG tablet Take 1 by mouth daily starting on December 23 4 tablet 0   metFORMIN (GLUCOPHAGE-XR) 500 MG 24 hr tablet Take by mouth.     NP THYROID 60 MG tablet Take 120 mg by mouth daily.     VICTOZA 18 MG/3ML SOPN Inject 1.2 mg into the skin daily.     Social History   Socioeconomic History   Marital status: Married    Spouse name: Not on file   Number of children: Not on file   Years of education: Not on file   Highest education  level: Not on file  Occupational History   Not on file  Tobacco Use   Smoking status: Never Smoker  Substance and Sexual Activity   Alcohol use: No   Drug use: No   Sexual activity: Not on file  Other Topics Concern   Not on file  Social History Narrative   Not on file   Social Determinants of Health   Financial Resource Strain:    Difficulty of Paying Living Expenses: Not on file  Food Insecurity:    Worried About Running Out of Food in the Last Year: Not on file   Ran Out of Food in the Last Year: Not on file  Transportation Needs:    Lack of Transportation (Medical): Not on file   Lack of Transportation (Non-Medical): Not on file  Physical Activity:    Days of Exercise per Week: Not on file   Minutes of Exercise per Session: Not on file  Stress:    Feeling of Stress : Not on file  Social Connections:    Frequency of Communication with Friends and Family: Not on file   Frequency of Social Gatherings with Friends and Family: Not on file   Attends Religious Services: Not on file   Active Member of Clubs or Organizations: Not on file   Attends Banker Meetings: Not on file   Marital Status: Not on file  Intimate Partner Violence:    Fear of Current or Ex-Partner: Not on file  Emotionally Abused: Not on file   Physically Abused: Not on file   Sexually Abused: Not on file   History reviewed. No pertinent family history.   OBJECTIVE:  Vitals:   04/12/20 1401  BP: 109/62  Pulse: 83  Resp: 20  Temp: 98.6 F (37 C)  SpO2: 94%    Physical Exam Vitals and nursing note reviewed.  Constitutional:      General: She is not in acute distress.    Appearance: Normal appearance. She is normal weight. She is not ill-appearing, toxic-appearing or diaphoretic.  HENT:     Head: Normocephalic.  Cardiovascular:     Rate and Rhythm: Normal rate and regular rhythm.     Pulses: Normal pulses.     Heart sounds: Normal heart sounds. No  murmur heard.  No friction rub. No gallop.   Pulmonary:     Effort: Pulmonary effort is normal. No respiratory distress.     Breath sounds: Normal breath sounds. No stridor. No wheezing, rhonchi or rales.  Chest:     Chest wall: No tenderness.  Abdominal:     General: Bowel sounds are normal. There is no distension.     Palpations: There is no mass.     Tenderness: There is generalized abdominal tenderness. There is no right CVA tenderness, left CVA tenderness, guarding or rebound.     Hernia: No hernia is present.     Comments: Bloated  Neurological:     Mental Status: She is alert and oriented to person, place, and time.      LABS: Results for orders placed or performed during the hospital encounter of 04/12/20 (from the past 24 hour(s))  POCT urinalysis dipstick     Status: Abnormal   Collection Time: 04/12/20  2:48 PM  Result Value Ref Range   Color, UA straw (A) yellow   Clarity, UA turbid (A) clear   Glucose, UA negative negative mg/dL   Bilirubin, UA small (A) negative   Ketones, POC UA trace (5) (A) negative mg/dL   Spec Grav, UA 2.536 6.440 - 1.025   Blood, UA trace-intact (A) negative   pH, UA 8.5 (A) 5.0 - 8.0   Protein Ur, POC =30 (A) negative mg/dL   Urobilinogen, UA 0.2 0.2 or 1.0 E.U./dL   Nitrite, UA Negative Negative   Leukocytes, UA Small (1+) (A) Negative    DIAGNOSTIC STUDIES: DG Abd 2 Views  Result Date: 04/12/2020 CLINICAL DATA:  Abdominal pain EXAM: X-RAY ABDOMEN 2 VIEWS COMPARISON:  None. FINDINGS: Supine and upright images were obtained. There is moderate stool throughout the colon. There is no bowel dilatation or air-fluid level to suggest bowel obstruction. No free air. There are surgical clips in the right quadrant. Lung bases are clear. IMPRESSION: No bowel obstruction or free air.  Lung bases clear. Electronically Signed   By: Bretta Bang III M.D.   On: 04/12/2020 14:27     ASSESSMENT & PLAN:  1. Abdominal pain, generalized     No  orders of the defined types were placed in this encounter.  Patient stable at discharge.  He was advised to go to ER for further evaluation to rule out other abdominal disease process.  Discharge instructions  Advised patient to go to ER for further evaluation  Reviewed expectations re: course of current medical issues. Questions answered. Outlined signs and symptoms indicating need for more acute intervention. Patient verbalized understanding. After Visit Summary given.   .Note: This document was prepared using Dragon voice recognition  software and may include unintentional dictation errors.    Durward Parcel, FNP 04/12/20 1504

## 2020-04-12 NOTE — Discharge Instructions (Addendum)
Advised patient to go to ER for further evaluation °

## 2020-04-12 NOTE — ED Triage Notes (Signed)
Pt to er, pt states that she was watching baseball last night and started having some lower abd pain.  States that movement made the pain worse, denies vomiting or diarrhea.  Pt reports some difficulty with urination.  Pt states that she went to urgent care and they sent her here for a uti.

## 2020-04-14 LAB — URINE CULTURE: Culture: >=100000 — AB

## 2020-12-19 ENCOUNTER — Other Ambulatory Visit (HOSPITAL_COMMUNITY): Payer: Self-pay | Admitting: Family Medicine

## 2020-12-19 DIAGNOSIS — Z1231 Encounter for screening mammogram for malignant neoplasm of breast: Secondary | ICD-10-CM

## 2021-01-15 ENCOUNTER — Other Ambulatory Visit: Payer: Self-pay

## 2021-01-15 ENCOUNTER — Ambulatory Visit (HOSPITAL_COMMUNITY)
Admission: RE | Admit: 2021-01-15 | Discharge: 2021-01-15 | Disposition: A | Discharge status: Home / Self Care | Payer: Medicare Other | Source: Ambulatory Visit | Attending: Family Medicine | Admitting: Family Medicine

## 2021-01-15 DIAGNOSIS — Z1231 Encounter for screening mammogram for malignant neoplasm of breast: Secondary | ICD-10-CM

## 2021-01-15 DIAGNOSIS — X58XXXA Exposure to other specified factors, initial encounter: Secondary | ICD-10-CM | POA: Diagnosis not present

## 2021-01-16 ENCOUNTER — Emergency Department (HOSPITAL_COMMUNITY): Payer: Medicare Other

## 2021-01-16 ENCOUNTER — Other Ambulatory Visit: Payer: Self-pay

## 2021-01-16 ENCOUNTER — Ambulatory Visit
Admission: EM | Admit: 2021-01-16 | Discharge: 2021-01-16 | Disposition: A | Discharge status: Other Acute Inpatient Hospital | Payer: Medicare Other | Attending: Family Medicine | Admitting: Family Medicine

## 2021-01-16 ENCOUNTER — Encounter: Payer: Self-pay | Admitting: Emergency Medicine

## 2021-01-16 ENCOUNTER — Inpatient Hospital Stay (HOSPITAL_COMMUNITY)
Admission: EM | Admit: 2021-01-16 | Discharge: 2021-01-17 | DRG: 205 | Disposition: A | Discharge status: Home / Self Care | Payer: Medicare Other | Attending: Pulmonary Disease | Admitting: Pulmonary Disease

## 2021-01-16 DIAGNOSIS — Z20822 Contact with and (suspected) exposure to covid-19: Secondary | ICD-10-CM | POA: Diagnosis present

## 2021-01-16 DIAGNOSIS — Z79899 Other long term (current) drug therapy: Secondary | ICD-10-CM

## 2021-01-16 DIAGNOSIS — J690 Pneumonitis due to inhalation of food and vomit: Secondary | ICD-10-CM | POA: Diagnosis present

## 2021-01-16 DIAGNOSIS — R0902 Hypoxemia: Secondary | ICD-10-CM | POA: Diagnosis present

## 2021-01-16 DIAGNOSIS — Z7984 Long term (current) use of oral hypoglycemic drugs: Secondary | ICD-10-CM | POA: Diagnosis not present

## 2021-01-16 DIAGNOSIS — T17598A Other foreign object in bronchus causing other injury, initial encounter: Principal | ICD-10-CM | POA: Diagnosis present

## 2021-01-16 DIAGNOSIS — I248 Other forms of acute ischemic heart disease: Secondary | ICD-10-CM | POA: Diagnosis present

## 2021-01-16 DIAGNOSIS — Z882 Allergy status to sulfonamides status: Secondary | ICD-10-CM | POA: Diagnosis not present

## 2021-01-16 DIAGNOSIS — X58XXXA Exposure to other specified factors, initial encounter: Secondary | ICD-10-CM | POA: Diagnosis present

## 2021-01-16 DIAGNOSIS — R778 Other specified abnormalities of plasma proteins: Secondary | ICD-10-CM

## 2021-01-16 DIAGNOSIS — J9601 Acute respiratory failure with hypoxia: Secondary | ICD-10-CM | POA: Diagnosis present

## 2021-01-16 DIAGNOSIS — T17908A Unspecified foreign body in respiratory tract, part unspecified causing other injury, initial encounter: Secondary | ICD-10-CM

## 2021-01-16 DIAGNOSIS — K219 Gastro-esophageal reflux disease without esophagitis: Secondary | ICD-10-CM | POA: Diagnosis present

## 2021-01-16 DIAGNOSIS — Z90710 Acquired absence of both cervix and uterus: Secondary | ICD-10-CM

## 2021-01-16 DIAGNOSIS — Z1231 Encounter for screening mammogram for malignant neoplasm of breast: Secondary | ICD-10-CM | POA: Diagnosis not present

## 2021-01-16 DIAGNOSIS — E119 Type 2 diabetes mellitus without complications: Secondary | ICD-10-CM | POA: Diagnosis present

## 2021-01-16 DIAGNOSIS — R062 Wheezing: Secondary | ICD-10-CM | POA: Diagnosis not present

## 2021-01-16 LAB — BASIC METABOLIC PANEL
Anion gap: 8 (ref 5–15)
BUN: 11 mg/dL (ref 8–23)
CO2: 26 mmol/L (ref 22–32)
Calcium: 8.8 mg/dL — ABNORMAL LOW (ref 8.9–10.3)
Chloride: 98 mmol/L (ref 98–111)
Creatinine, Ser: 0.53 mg/dL (ref 0.44–1.00)
GFR, Estimated: >60 mL/min (ref >60)
Glucose, Bld: 132 mg/dL — ABNORMAL HIGH (ref 70–99)
Potassium: 3.9 mmol/L (ref 3.5–5.1)
Sodium: 132 mmol/L — ABNORMAL LOW (ref 135–145)

## 2021-01-16 LAB — RESP PANEL BY RT-PCR (FLU A&B, COVID) ARPGX2
Influenza A by PCR: NEGATIVE
Influenza B by PCR: NEGATIVE
SARS Coronavirus 2 by RT PCR: NEGATIVE

## 2021-01-16 LAB — CBC WITH DIFFERENTIAL/PLATELET
Abs Immature Granulocytes: 0.04 10*3/uL (ref 0.00–0.07)
Basophils Absolute: 0 10*3/uL (ref 0.0–0.1)
Basophils Relative: 0 %
Eosinophils Absolute: 0 10*3/uL (ref 0.0–0.5)
Eosinophils Relative: 0 %
HCT: 40.5 % (ref 36.0–46.0)
Hemoglobin: 13.2 g/dL (ref 12.0–15.0)
Immature Granulocytes: 0 %
Lymphocytes Relative: 6 %
Lymphs Abs: 0.5 10*3/uL — ABNORMAL LOW (ref 0.7–4.0)
MCH: 29.6 pg (ref 26.0–34.0)
MCHC: 32.6 g/dL (ref 30.0–36.0)
MCV: 90.8 fL (ref 80.0–100.0)
Monocytes Absolute: 0.5 10*3/uL (ref 0.1–1.0)
Monocytes Relative: 5 %
Neutro Abs: 8.4 10*3/uL — ABNORMAL HIGH (ref 1.7–7.7)
Neutrophils Relative %: 89 %
Platelets: 298 10*3/uL (ref 150–400)
RBC: 4.46 MIL/uL (ref 3.87–5.11)
RDW: 13.2 % (ref 11.5–15.5)
WBC: 9.6 10*3/uL (ref 4.0–10.5)
nRBC: 0 % (ref 0.0–0.2)

## 2021-01-16 LAB — BRAIN NATRIURETIC PEPTIDE: B Natriuretic Peptide: 75 pg/mL (ref 0.0–100.0)

## 2021-01-16 LAB — BLOOD GAS, VENOUS
Acid-Base Excess: 1.5 mmol/L (ref 0.0–2.0)
Bicarbonate: 24.2 mmol/L (ref 20.0–28.0)
FIO2: 88
O2 Saturation: 43.4 %
Patient temperature: 36.8
pCO2, Ven: 45.9 mmHg (ref 44.0–60.0)
pH, Ven: 7.374 (ref 7.250–7.430)
pO2, Ven: <31 mmHg — CL (ref 32.0–45.0)

## 2021-01-16 LAB — TROPONIN I (HIGH SENSITIVITY): Troponin I (High Sensitivity): 226 ng/L (ref <18)

## 2021-01-16 MED ORDER — ALBUTEROL SULFATE (2.5 MG/3ML) 0.083% IN NEBU
5.0000 mg | INHALATION_SOLUTION | Freq: Once | RESPIRATORY_TRACT | Status: AC
  Administered 2021-01-16: 5 mg via RESPIRATORY_TRACT
  Filled 2021-01-16: qty 6

## 2021-01-16 MED ORDER — METHYLPREDNISOLONE SODIUM SUCC 125 MG IJ SOLR
80.0000 mg | Freq: Once | INTRAMUSCULAR | Status: AC
  Administered 2021-01-16: 80 mg via INTRAVENOUS
  Filled 2021-01-16: qty 2

## 2021-01-16 MED ORDER — SODIUM CHLORIDE 0.9 % IV BOLUS
500.0000 mL | Freq: Once | INTRAVENOUS | Status: AC
  Administered 2021-01-16: 500 mL via INTRAVENOUS

## 2021-01-16 MED ORDER — FENTANYL CITRATE (PF) 100 MCG/2ML IJ SOLN
25.0000 ug | INTRAMUSCULAR | Status: DC | PRN
  Administered 2021-01-16 – 2021-01-17 (×3): 25 ug via INTRAVENOUS
  Filled 2021-01-16 (×3): qty 2

## 2021-01-16 MED ORDER — IPRATROPIUM BROMIDE 0.02 % IN SOLN
0.5000 mg | Freq: Once | RESPIRATORY_TRACT | Status: AC
  Administered 2021-01-16: 0.5 mg via RESPIRATORY_TRACT
  Filled 2021-01-16: qty 2.5

## 2021-01-16 MED ORDER — IOHEXOL 350 MG/ML SOLN
75.0000 mL | Freq: Once | INTRAVENOUS | Status: AC | PRN
  Administered 2021-01-16: 75 mL via INTRAVENOUS

## 2021-01-16 NOTE — ED Triage Notes (Signed)
Pt here for cough and wheezing after taking at pill at lunch time today; pt appears distressed at present denies swelling or trouble swallowing secretions; O2 noted 88-89%; NP notified and EMS called

## 2021-01-16 NOTE — ED Triage Notes (Signed)
Pt reports her daughter gave her an Albuterol breathing treatment, Ventolin inhaler, OTC allergy pill and Robitussin PTA.

## 2021-01-16 NOTE — Progress Notes (Signed)
During breathing treatment patient had a coughing episode and sats dropped down to the lowest 82% while treatment was running. Patient states she does not wear O2 at home. Patient was able to slowly recover sats of 92. Nasal Cannula placed back on patient @ 2L sats 92 at this time. Will monitor as needed.

## 2021-01-16 NOTE — ED Provider Notes (Signed)
[facility]Y [facility]PART[facility] Provider Note   CSN: [id number] Arrival date & time: 01/16/21  1501     History Chief Complaint  Patient presents with   Shortness of Breath    Leonard SchwartzGail P Mazzoni is a 80 y.o. female.  Patient with diabetes history, no lung disease, non-smoker presents with worsening cough and shortness of breath.  The timing seem to be after taking her diabetic medication.  This is not a new medication, no hives, no itching.  Patient does not feel she choked on it.  Gradually worsening symptoms since then.  Sent from urgent care for hypoxia and shortness of breath.  No blood clot history, no recent surgeries.      Past Medical History:  Diagnosis Date   Diabetes mellitus without complication [facility](HCC)     Patient Active Problem List   Diagnosis Date Noted   Hypoxia 01/16/2021    Past Surgical History:  Procedure Laterality Date   ABDOMINAL HYSTERECTOMY     ABDOMINAL SURGERY     bilateral tubal ligagtion     TONSILLECTOMY       OB History   No obstetric history on file.     No family history on file.  Social History   Tobacco Use   Smoking status: Never   Smokeless tobacco: Never  Substance Use Topics   Alcohol use: No   Drug use: No    Home Medications Prior to Admission medications   Medication Sig Start Date End Date Taking? Authorizing Provider  metFORMIN (GLUCOPHAGE-XR) 500 MG 24 hr tablet Take 500-1,000 mg by mouth. 1 tablet in am and 2 tablets in the evening 03/27/20  Yes [provider]  Multiple Vitamin (MULTIVITAMIN) tablet Take 1 tablet by mouth daily.   Yes [provider]  NP THYROID 60 MG tablet Take 120 mg by mouth daily. 02/28/20  Yes [provider]  pantoprazole (PROTONIX) 40 MG tablet Take 1 tablet by mouth daily. 12/21/20  Yes [provider]  VICTOZA 18 MG/3ML SOPN Inject 1.2 mg into the skin daily. 04/08/20  Yes [provider]  azithromycin (ZITHROMAX) 250 MG tablet Take 1 by mouth  daily starting on December 23 Patient not taking: Reported on 01/16/2021 07/20/11   Cheri Guppyaporossi, Jeffrey, MD    Allergies    Sulfa antibiotics  Review of Systems   Review of Systems  Constitutional:  Negative for chills and fever.  HENT:  Negative for congestion.   Eyes:  Negative for visual disturbance.  Respiratory:  Positive for cough and shortness of breath.   Cardiovascular:  Negative for chest pain.  Gastrointestinal:  Negative for abdominal pain and vomiting.  Genitourinary:  Negative for dysuria and flank pain.  Musculoskeletal:  Negative for back pain, neck pain and neck stiffness.  Skin:  Negative for rash.  Neurological:  Negative for light-headedness and headaches.   Physical Exam Updated Vital Signs BP (!) 121/57   Pulse 83   Temp 98.3 F (36.8 C) (Oral)   Resp (!) 24   Ht 5\' 3"  (1.6 m)   Wt 55 kg   SpO2 96%   BMI 21.48 kg/m   Physical Exam Vitals and nursing note reviewed.  Constitutional:      General: She is not in acute distress.    Appearance: She is well-developed.  HENT:     Head: Normocephalic and atraumatic.     Mouth/Throat:     Mouth: Mucous membranes are dry.  Eyes:     General:  Right eye: No discharge.        Left eye: No discharge.     Conjunctiva/sclera: Conjunctivae normal.  Neck:     Trachea: No tracheal deviation.  Cardiovascular:     Rate and Rhythm: Regular rhythm. Tachycardia present.     Heart sounds: No murmur heard. Pulmonary:     Effort: Tachypnea present.     Breath sounds: Examination of the right-upper field reveals wheezing. Examination of the left-upper field reveals wheezing. Examination of the right-middle field reveals decreased breath sounds and wheezing. Decreased breath sounds and wheezing present.  Abdominal:     General: There is no distension.     Palpations: Abdomen is soft.     Tenderness: There is no abdominal tenderness. There is no guarding.  Musculoskeletal:     Cervical back: Normal range of  motion and neck supple. No rigidity.     Right lower leg: No edema.     Left lower leg: No edema.  Skin:    General: Skin is warm.     Capillary Refill: Capillary refill takes less than 2 seconds.     Findings: No rash.  Neurological:     General: No focal deficit present.     Mental Status: She is alert.     Cranial Nerves: No cranial nerve deficit.  Psychiatric:        Mood and Affect: Mood normal.    ED Results / Procedures / Treatments   Labs (all labs ordered are listed, but only abnormal results are displayed) Labs Reviewed  CBC WITH DIFFERENTIAL/PLATELET - Abnormal; Notable for the following components:      Result Value   Neutro Abs 8.4 (*)    Lymphs Abs 0.5 (*)    All other components within normal limits  BASIC METABOLIC PANEL - Abnormal; Notable for the following components:   Sodium 132 (*)    Glucose, Bld 132 (*)    Calcium 8.8 (*)    All other components within normal limits  BLOOD GAS, VENOUS - Abnormal; Notable for the following components:   pO2, Ven <31.0 (*)    All other components within normal limits  TROPONIN I (HIGH SENSITIVITY) - Abnormal; Notable for the following components:   Troponin I (High Sensitivity) 226 (*)    All other components within normal limits  RESP PANEL BY RT-PCR (FLU A&B, COVID) ARPGX2  BRAIN NATRIURETIC PEPTIDE    EKG None  Radiology CT Angio Chest PE W and/or Wo Contrast  Result Date: 01/16/2021 CLINICAL DATA:  80 year old female with chest pain and shortness of breath. EXAM: CT ANGIOGRAPHY CHEST WITH CONTRAST TECHNIQUE: Multidetector CT imaging of the chest was performed using the standard protocol during bolus administration of intravenous contrast. Multiplanar CT image reconstructions and MIPs were obtained to evaluate the vascular anatomy. CONTRAST:  90mL OMNIPAQUE IOHEXOL 350 MG/ML SOLN COMPARISON:  Chest radiograph dated 01/16/2021. FINDINGS: Evaluation of this exam is limited due to respiratory motion artifact.  Cardiovascular: Top-normal cardiac size. No pericardial effusion. Moderate atherosclerotic calcification of the thoracic aorta. No aneurysmal dilatation or dissection. The origins of the great vessels of the aortic arch appear patent as visualized. No pulmonary artery embolus identified. Mediastinum/Nodes: There is no hilar or mediastinal adenopathy. Small hiatal hernia. The esophagus is grossly unremarkable. No mediastinal fluid collection. Lungs/Pleura: Left lung base linear atelectasis/scarring. No focal consolidation, pleural effusion, or pneumothorax. Biapical subpleural scarring. There is mucous impaction in the left lower lobe bronchi. A 1.6 x 0.8 cm high attenuating ovoid  structure in the region of the left mainstem bronchus (41/4) is concerning for an enhancing in the bronchial lesion. Further evaluation with bronchoscopy is recommended. The central airways remain patent. Upper Abdomen: Cholecystectomy and dilated common bile duct. Musculoskeletal: Osteopenia with degenerative changes of the spine. No acute osseous pathology. Review of the MIP images confirms the above findings. IMPRESSION: 1. No acute intrathoracic pathology. No CT evidence of pulmonary artery embolus. 2. A 1.6 x 0.8 cm probable enhancing endobronchial lesion in the left mainstem bronchus. Further evaluation with bronchoscopy is recommended. 3. Aortic Atherosclerosis (ICD10-I70.0). Electronically Signed   By: Elgie Collard M.D.   On: 01/16/2021 20:08   DG Chest Portable 1 View  Result Date: 01/16/2021 CLINICAL DATA:  Cough and wheezing short of breath EXAM: PORTABLE CHEST 1 VIEW COMPARISON:  07/20/2011 FINDINGS: The heart size and mediastinal contours are within normal limits. Both lungs are clear. The visualized skeletal structures are unremarkable. IMPRESSION: No active disease. Electronically Signed   By: Marlan Palau M.D.   On: 01/16/2021 16:40   MM 3D SCREEN BREAST BILATERAL  Result Date: 01/15/2021 CLINICAL DATA:   Screening. EXAM: DIGITAL SCREENING BILATERAL MAMMOGRAM WITH TOMOSYNTHESIS AND CAD TECHNIQUE: Bilateral screening digital craniocaudal and mediolateral oblique mammograms were obtained. Bilateral screening digital breast tomosynthesis was performed. The images were evaluated with computer-aided detection. COMPARISON:  Previous exam(s). ACR Breast Density Category c: The breast tissue is heterogeneously dense, which may obscure small masses. FINDINGS: There are no findings suspicious for malignancy. IMPRESSION: No mammographic evidence of malignancy. A result letter of this screening mammogram will be mailed directly to the patient. RECOMMENDATION: Screening mammogram in one year. (Code:SM-B-01Y) BI-RADS CATEGORY  1: Negative. Electronically Signed   By: Gerome Sam III M.D   On: 01/15/2021 17:07    Procedures .Critical Care  Date/Time: 01/16/2021 10:31 PM Performed by: Blane Ohara, MD Authorized by: Blane Ohara, MD   Critical care provider statement:    Critical care time (minutes):  150   Critical care start time:  01/16/2021 8:00 PM   Critical care end time:  01/16/2021 10:30 PM   Critical care time was exclusive of:  Separately billable procedures and treating other patients and teaching time   Critical care was necessary to treat or prevent imminent or life-threatening deterioration of the following conditions:  Respiratory failure   Critical care was time spent personally by me on the following activities:  Discussions with consultants, evaluation of patient's response to treatment, examination of patient, ordering and performing treatments and interventions, ordering and review of laboratory studies, ordering and review of radiographic studies, pulse oximetry, re-evaluation of patient's condition, obtaining history from patient or surrogate and review of old charts   Care discussed with: admitting provider     Medications Ordered in ED Medications  fentaNYL (SUBLIMAZE) injection 25  mcg (25 mcg Intravenous Given 01/16/21 2136)  sodium chloride 0.9 % bolus 500 mL (has no administration in time range)  albuterol (PROVENTIL) (2.5 MG/3ML) 0.083% nebulizer solution 5 mg (5 mg Nebulization Given 01/16/21 1631)  ipratropium (ATROVENT) nebulizer solution 0.5 mg (0.5 mg Nebulization Given 01/16/21 1631)  methylPREDNISolone sodium succinate (SOLU-MEDROL) 125 mg/2 mL injection 80 mg (80 mg Intravenous Given 01/16/21 1731)  albuterol (PROVENTIL) (2.5 MG/3ML) 0.083% nebulizer solution 5 mg (5 mg Nebulization Given 01/16/21 1740)  ipratropium (ATROVENT) nebulizer solution 0.5 mg (0.5 mg Nebulization Given 01/16/21 1740)  sodium chloride 0.9 % bolus 500 mL (0 mLs Intravenous Stopped 01/16/21 1937)  iohexol (OMNIPAQUE) 350 MG/ML injection 75  mL (75 mLs Intravenous Contrast Given 01/16/21 1950)    ED Course  I have reviewed the triage vital signs and the nursing notes.  Pertinent labs & imaging results that were available during my care of the patient were reviewed by me and considered in my medical decision making (see chart for details).    MDM Rules/Calculators/A&P                          Patient presents with worsening cough, shortness of breath with association of taking her medication.  No rash, no stridor however patient does have wheezing decreased air movement.  Albuterol nebs ordered and repeated, mild improvement in air movement however oxygen 88%.  1 to 2 L nasal cannula as needed.  Steroid dose given.  Chest x-ray reviewed no obvious abnormality.  Differential includes inflammatory/spasm approach, aspiration, pneumonia, pulm embolism, other.  No chest pain.  EKG ordered.  No callback from hospitalist, repaged.  Patient gradually worsened on multiple rechecks requiring more nasal cannula oxygenation.  Eventually 6 L she was still in the upper 80s, nonrebreather improved her temporarily.  Patient started having some chest pain with coughing and breathing.  Fentanyl ordered and troponin  ordered elevated to 22 reviewed.  Discussed with hospitalist not comfortable keeping the patient in the [facility], ICU.  Paged critical care at [facility], repaged.  Discussed with Dr. Madilyn Hook who accepted the patient to the ICU.  IV fluid bolus ordered.  Patient received steroids.  Concern for aspiration pneumonitis with hypoxia.  CT scan results reviewed no pulmonary embolism, patient on the bronchoscopy for lesion noted.    Final Clinical Impression(s) / ED Diagnoses Final diagnoses:  Aspiration pneumonitis (HCC)  Acute respiratory failure with hypoxia (HCC)  Troponin level elevated    Rx / DC Orders ED Discharge Orders     None        Blane Ohara, MD 01/16/21 2232

## 2021-01-16 NOTE — ED Provider Notes (Signed)
[facility]    CSN: [id number] Arrival date & time: 01/16/21  1415      History   Chief Complaint Chief Complaint  Patient presents with   Cough    HPI Christine Mcfarland is a 80 y.o. female.   HPI  Patient presents today accompanied by her daughter complaining of shortness of breath and a persistent cough since taking her diabetic medication after lunch.  Prior to her swallowing the medication she reports that she was in her normal state of health.  She has been persistently coughing and wheezing since taking the medication.  She reports she did feel the sensation that the pill stuck in her throat but right now is denying any problems with swallowing.  On arrival here to urgent care her oxygen level was 88 to 89% she was placed on 2 L of oxygen and oxygen level improved to 90 to 91%.  She received a albuterol breathing treatment belonging to her grandson prior to arriving here and she continues to wheeze.  She has no history of any underlying COPD or asthma.  Past Medical History:  Diagnosis Date   Diabetes mellitus without complication (HCC)     There are no problems to display for this patient.   Past Surgical History:  Procedure Laterality Date   ABDOMINAL HYSTERECTOMY     ABDOMINAL SURGERY     bilateral tubal ligagtion     TONSILLECTOMY      OB History   No obstetric history on file.      Home Medications    Prior to Admission medications   Medication Sig Start Date End Date Taking? Authorizing Provider  azithromycin (ZITHROMAX) 250 MG tablet Take 1 by mouth daily starting on December 23 07/20/11   Cheri Guppy, MD  metFORMIN (GLUCOPHAGE-XR) 500 MG 24 hr tablet Take by mouth. 03/27/20   [provider]  NP THYROID 60 MG tablet Take 120 mg by mouth daily. 02/28/20   [provider]  VICTOZA 18 MG/3ML SOPN Inject 1.2 mg into the skin daily. 04/08/20   [provider]    Family History History reviewed. No pertinent family  history.  Social History Social History   Tobacco Use   Smoking status: Never   Smokeless tobacco: Never  Substance Use Topics   Alcohol use: No   Drug use: No     Allergies   Sulfa antibiotics   Review of Systems Review of Systems   Physical Exam Triage Vital Signs ED Triage Vitals [01/16/21 1434]  Enc Vitals Group     BP (!) 153/80     Pulse Rate (!) 102     Resp 20     Temp 98.6 F (37 C)     Temp Source Oral     SpO2 (!) 89 %     Weight      Height      Head Circumference      Peak Flow      Pain Score 0     Pain Loc      Pain Edu?      Excl. in GC?    No data found.  Updated Vital Signs BP (!) 153/80 (BP Location: Right Arm)   Pulse (!) 102   Temp 98.6 F (37 C) (Oral)   Resp 20   SpO2 90%   Visual Acuity Right Eye Distance:   Left Eye Distance:   Bilateral Distance:    Right Eye Near:   Left Eye  Near:    Bilateral Near:     Physical Exam Constitutional:      General: She is in acute distress.  Cardiovascular:     Rate and Rhythm: Tachycardia present.  Pulmonary:     Breath sounds: Stridor present. Wheezing present.     Comments: Persistent coughing and audible wheezing bilateral mid to upper lobes Neurological:     General: No focal deficit present.     Gait: Gait normal.  Psychiatric:        Mood and Affect: Mood normal.        Behavior: Behavior normal.        Thought Content: Thought content normal.        Judgment: Judgment normal.     UC Treatments / Results  Labs (all labs ordered are listed, but only abnormal results are displayed) Labs Reviewed - No data to display  EKG NSR  with sinus arrhythmia, rate 98, no ST changes  Radiology   Procedures Procedures (including critical care time)  Medications Ordered in UC Medications - No data to display  Initial Impression / Assessment and Plan / UC Course  I have reviewed the triage vital signs and the nursing notes.  Pertinent labs & imaging results that were  available during my care of the patient were reviewed by me and considered in my medical decision making (see chart for details).     Given acute onset of symptoms and shortness of breath suspect possible aspiration as a source of current symptoms.  EMS called as patient is having difficulty breathing and oxygen saturations are abnormal.  EMS arrived patient being transported to [facility]. Final Clinical Impressions(s) / UC Diagnoses   Final diagnoses:  Wheezing  Aspiration into airway, initial encounter   Discharge Instructions   None    ED Prescriptions   None    PDMP not reviewed this encounter.   Bing Neighbors, FNP 01/16/21 1455

## 2021-01-16 NOTE — ED Triage Notes (Signed)
Patient via EMS from Urgent care due to low saturation and cough after taking otc medication for DM. 94% on 2L/[place]. 92% on RA. Wheezing to upper left. Cough of unknown origin. Covid test at urgent care pending.

## 2021-01-16 NOTE — ED Notes (Signed)
Lab to add on BNP.  °

## 2021-01-17 DIAGNOSIS — J9601 Acute respiratory failure with hypoxia: Secondary | ICD-10-CM

## 2021-01-17 DIAGNOSIS — J690 Pneumonitis due to inhalation of food and vomit: Secondary | ICD-10-CM

## 2021-01-17 DIAGNOSIS — R778 Other specified abnormalities of plasma proteins: Secondary | ICD-10-CM

## 2021-01-17 LAB — COMPREHENSIVE METABOLIC PANEL
ALT: 26 U/L (ref 0–44)
AST: 25 U/L (ref 15–41)
Albumin: 3.9 g/dL (ref 3.5–5.0)
Alkaline Phosphatase: 50 U/L (ref 38–126)
Anion gap: 9 (ref 5–15)
BUN: 9 mg/dL (ref 8–23)
CO2: 24 mmol/L (ref 22–32)
Calcium: 8.4 mg/dL — ABNORMAL LOW (ref 8.9–10.3)
Chloride: 99 mmol/L (ref 98–111)
Creatinine, Ser: 0.37 mg/dL — ABNORMAL LOW (ref 0.44–1.00)
GFR, Estimated: >60 mL/min (ref >60)
Glucose, Bld: 138 mg/dL — ABNORMAL HIGH (ref 70–99)
Potassium: 3.9 mmol/L (ref 3.5–5.1)
Sodium: 132 mmol/L — ABNORMAL LOW (ref 135–145)
Total Bilirubin: 0.6 mg/dL (ref 0.3–1.2)
Total Protein: 6.2 g/dL — ABNORMAL LOW (ref 6.5–8.1)

## 2021-01-17 LAB — CBC
HCT: 38.4 % (ref 36.0–46.0)
Hemoglobin: 12.6 g/dL (ref 12.0–15.0)
MCH: 29 pg (ref 26.0–34.0)
MCHC: 32.8 g/dL (ref 30.0–36.0)
MCV: 88.3 fL (ref 80.0–100.0)
Platelets: 318 10*3/uL (ref 150–400)
RBC: 4.35 MIL/uL (ref 3.87–5.11)
RDW: 13.2 % (ref 11.5–15.5)
WBC: 14.7 10*3/uL — ABNORMAL HIGH (ref 4.0–10.5)
nRBC: 0 % (ref 0.0–0.2)

## 2021-01-17 LAB — MRSA NEXT GEN BY PCR, NASAL: MRSA by PCR Next Gen: NOT DETECTED

## 2021-01-17 LAB — CREATININE, SERUM
Creatinine, Ser: 0.34 mg/dL — ABNORMAL LOW (ref 0.44–1.00)
GFR, Estimated: >60 mL/min (ref >60)

## 2021-01-17 LAB — GLUCOSE, CAPILLARY
Glucose-Capillary: 189 mg/dL — ABNORMAL HIGH (ref 70–99)
Glucose-Capillary: 142 mg/dL — ABNORMAL HIGH (ref 70–99)
Glucose-Capillary: 112 mg/dL — ABNORMAL HIGH (ref 70–99)
Glucose-Capillary: 107 mg/dL — ABNORMAL HIGH (ref 70–99)

## 2021-01-17 LAB — TROPONIN I (HIGH SENSITIVITY)
Troponin I (High Sensitivity): 325 ng/L (ref <18)
Troponin I (High Sensitivity): 247 ng/L (ref <18)
Troponin I (High Sensitivity): 297 ng/L (ref <18)

## 2021-01-17 LAB — MAGNESIUM: Magnesium: 1.7 mg/dL (ref 1.7–2.4)

## 2021-01-17 LAB — HEMOGLOBIN A1C
Hgb A1c MFr Bld: 5.9 % — ABNORMAL HIGH (ref 4.8–5.6)
Mean Plasma Glucose: 122.63 mg/dL

## 2021-01-17 MED ORDER — METHYLPREDNISOLONE SODIUM SUCC 40 MG IJ SOLR
40.0000 mg | Freq: Two times a day (BID) | INTRAMUSCULAR | Status: DC
  Filled 2021-01-17: qty 1

## 2021-01-17 MED ORDER — LIDOCAINE VISCOUS HCL 2 % MT SOLN
15.0000 mL | OROMUCOSAL | Status: AC
  Administered 2021-01-17: 5 mL via OROMUCOSAL
  Filled 2021-01-17: qty 15

## 2021-01-17 MED ORDER — LIDOCAINE HCL (PF) 1 % IJ SOLN
INTRAMUSCULAR | Status: AC
  Filled 2021-01-17: qty 5

## 2021-01-17 MED ORDER — DOCUSATE SODIUM 100 MG PO CAPS: 100.0000 mg | ORAL_CAPSULE | Freq: Two times a day (BID) | ORAL | Status: DC | PRN

## 2021-01-17 MED ORDER — INSULIN ASPART 100 UNIT/ML IJ SOLN
0.0000 [IU] | INTRAMUSCULAR | Status: DC
  Administered 2021-01-17: 1 [IU] via SUBCUTANEOUS
  Administered 2021-01-17: 2 [IU] via SUBCUTANEOUS

## 2021-01-17 MED ORDER — HEPARIN SODIUM (PORCINE) 5000 UNIT/ML IJ SOLN
5000.0000 [IU] | Freq: Three times a day (TID) | INTRAMUSCULAR | Status: DC
  Administered 2021-01-17 (×2): 5000 [IU] via SUBCUTANEOUS
  Filled 2021-01-17 (×2): qty 1

## 2021-01-17 MED ORDER — IPRATROPIUM-ALBUTEROL 0.5-2.5 (3) MG/3ML IN SOLN: 3.0000 mL | RESPIRATORY_TRACT | Status: DC | PRN

## 2021-01-17 MED ORDER — LACTATED RINGERS IV SOLN: INTRAVENOUS | Status: DC

## 2021-01-17 MED ORDER — MIDAZOLAM HCL 2 MG/2ML IJ SOLN
4.0000 mg | Freq: Once | INTRAMUSCULAR | Status: AC
  Administered 2021-01-17: 2 mg via INTRAVENOUS
  Filled 2021-01-17: qty 4

## 2021-01-17 MED ORDER — ACETAMINOPHEN 325 MG PO TABS: 650.0000 mg | ORAL_TABLET | ORAL | Status: DC | PRN

## 2021-01-17 MED ORDER — FENTANYL CITRATE (PF) 100 MCG/2ML IJ SOLN
INTRAMUSCULAR | Status: AC
  Filled 2021-01-17: qty 4

## 2021-01-17 MED ORDER — SODIUM CHLORIDE 0.9 % IV SOLN
3.0000 g | Freq: Four times a day (QID) | INTRAVENOUS | Status: DC
  Administered 2021-01-17 (×2): 3 g via INTRAVENOUS
  Filled 2021-01-17 (×2): qty 8

## 2021-01-17 MED ORDER — POLYETHYLENE GLYCOL 3350 17 G PO PACK: 17.0000 g | PACK | Freq: Every day | ORAL | Status: DC | PRN

## 2021-01-17 MED ORDER — CHLORHEXIDINE GLUCONATE CLOTH 2 % EX PADS
6.0000 | MEDICATED_PAD | Freq: Every day | CUTANEOUS | Status: DC
  Administered 2021-01-17: 6 via TOPICAL

## 2021-01-17 MED ORDER — MIDAZOLAM HCL 2 MG/2ML IJ SOLN
INTRAMUSCULAR | Status: AC
  Filled 2021-01-17: qty 4

## 2021-01-17 MED ORDER — FENTANYL CITRATE (PF) 100 MCG/2ML IJ SOLN
200.0000 ug | Freq: Once | INTRAMUSCULAR | Status: AC
  Administered 2021-01-17: 50 ug via INTRAVENOUS
  Filled 2021-01-17: qty 4

## 2021-01-17 MED ORDER — IPRATROPIUM-ALBUTEROL 0.5-2.5 (3) MG/3ML IN SOLN
3.0000 mL | Freq: Four times a day (QID) | RESPIRATORY_TRACT | Status: DC
  Administered 2021-01-17 (×2): 3 mL via RESPIRATORY_TRACT
  Filled 2021-01-17 (×2): qty 3

## 2021-01-17 MED ORDER — PANTOPRAZOLE SODIUM 40 MG PO TBEC
40.0000 mg | DELAYED_RELEASE_TABLET | Freq: Every day | ORAL | Status: DC
  Administered 2021-01-17: 40 mg via ORAL
  Filled 2021-01-17: qty 1

## 2021-01-17 NOTE — Progress Notes (Signed)
RTx3 assisted MD with bedside bronchoscopy. Patient hyperoxygenated before procedure with NRB. No complications. RT will continue to monitor as needed.

## 2021-01-17 NOTE — ED Notes (Signed)
Pt has been picked up by [facility] to be transferred to [facility] ICU

## 2021-01-17 NOTE — H&P (Signed)
NAME:  Christine Mcfarland, MRN:  [id number], DOB:  [birth year], LOS: 1 ADMISSION DATE:  01/16/2021, CONSULTATION DATE:  6/21 REFERRING MD:  Dr. Reather Converse, CHIEF COMPLAINT:  cough and SOB   History of Present Illness:  Patient is a 80 yo F w/ pertinent PMH of diabetes and GERD presents to [facility] on 6/21 with worsening cough and SOB.  Patient states her coughing/sob began shortly after taking her diabetes medication. Denies hives, itching, swelling. Denies aspiration. Patient is a non-smoker and no hx of lung disease.  ED course 6/21: Patient has wheezing w/ dim BS bilaterally. Mildly improved w/ albuterol nebs. Sats around 88% and was placed on 1-2 l/m [place]. Started on IV fluids and steroids. VBG normal. Normal WBC and hgb. CXR appears normal. CTA chest negative for PE, 1.6 X 0.8 cm endobronchial lesion in L mainstem bronchus (further evaluation w/ bronchoscopy recommended). O2 requirements increasing and patient placed on NRB. Now patient on BiPAP. Patient having chest pain w/ coughing. Prn fentanyl ordered. Troponin elevated to 226. EKG normal sinus rhythm. Concern for aspiration pneumonitis w/ hypoxia.   PCCM consulted for bronchoscopy, [facility] ICU admission, and management of SOB/hypoxia.  Pertinent  Medical History   Past Medical History:  Diagnosis Date   Diabetes mellitus without complication ([place])      Significant Hospital Events: Including procedures, antibiotic start and stop dates in addition to other pertinent events   6/21: Arrived [facility] ED w/ hypoxia and SOB. CTA chest show L mainstem endobronchial lesion. PCCM consulted for transfer to [facility] ICU admission for bronchoscopy procedure. 6/22: patient transferred from [facility] to [facility] 90m[facility]  Interim History / Subjective:  Patient on BiPAP 70% with O2 sats 96%; NAD Patient able to speak and states breathing is comfortable currently without SOB  Objective   Blood pressure (!) 187/78, pulse (!) 120, temperature 98.3 F (36.8 C), temperature source  Oral, resp. rate (!) 34, height _0  (1.6 m), weight 55 kg, SpO2 96 %.        Intake/Output Summary (Last 24 hours) at 01/17/2021 0020 Last data filed at 01/16/2021 2340 Gross per 24 hour  Intake 1000 ml  Output --  Net 1000 ml   Filed Weights   01/16/21 1508  Weight: 55 kg    Examination: General:  On BiPAP in NAD HEENT: MM pink/moist; bipap mask in place Neuro: Aox3; moves all extremities CV: s1s2, RRR, no m/r/g PULM:  dim clear bs bilaterally; BiPAP 70% with sats 96% GI: soft, bsx4 active  Extremities: warm/dry, no edema  Skin: no rashes or lesions   Labs/imaging that I havepersonally reviewed  (right click and "Reselect all SmartList Selections" daily)  Na 132, K 3.9 Glucose 132 WBC 9.6, Hgb 13.2 BNP 75 VBG normal Troponin 226 EKG NSR Covid and Flu negative CTA chest and CXR reviewed: results in HPI  Resolved Hospital Problem list     Assessment & Plan:   Acute respiratory failure w/ hypoxia: possible aspiration pneumonitis; L mainstem endobronchial lesion on CT chest P: -Admit to ICU for telemetry monitoring -Will transition off BiPAP to heated HFNC; BiPAP on standby if increased SOB/hypoxia -Wean fio2 for sats >92% -CPT TID and duoneb q6 and prn -Plan for bronchoscopy procedure later today; consider obtaining BAL during bronch procedure -Continue IV fluids -Will order unasyn for aspiration coverage  Elevated Troponin: EKG NSR; likely due to acute respiratory illness P: -Trend Troponin -continuous telemetry monitoring  Hx of DM2 P: -SSI and CBG monitoring -NPO for now  while SOB and on BiPAP   Best Practice (right click and "Reselect all SmartList Selections" daily)   Diet/type: NPO w/ oral meds Pain/Anxiety/Delirium protocol Not indicated VAP protocol (if indicated): Not indicated DVT prophylaxis: systemic heparin GI prophylaxis: PPI Glucose control:  SSI Central venous access:  N/A Arterial line:  N/A Foley:  N/A Mobility:  bed rest   PT consulted: N/A Studies pending: None Culture data pending:sputum after bronchoscopy procedure later today Last reviewed culture data:today Antibiotics:unasyn Antibiotic de-escalation: no,  continue current rx Stop date: to be determined  Daily labs: ordered Code Status:  full code Last date of multidisciplinary goals of care discussion [pending] ccm prognosis: Life-threating Disposition: remains critically ill, will stay in intensive care        Labs   CBC: Recent Labs  Lab 01/16/21 1600  WBC 9.6  NEUTROABS 8.4*  HGB 13.2  HCT 40.5  MCV 90.8  PLT 814    Basic Metabolic Panel: Recent Labs  Lab 01/16/21 1600  NA 132*  K 3.9  CL 98  CO2 26  GLUCOSE 132*  BUN 11  CREATININE 0.53  CALCIUM 8.8*   GFR: Estimated Creatinine Clearance: 47.2 mL/min (by C-G formula based on SCr of 0.53 mg/dL). Recent Labs  Lab 01/16/21 1600  WBC 9.6    Liver Function Tests: No results for input(s): AST, ALT, ALKPHOS, BILITOT, PROT, ALBUMIN in the last 168 hours. No results for input(s): LIPASE, AMYLASE in the last 168 hours. No results for input(s): AMMONIA in the last 168 hours.  ABG    Component Value Date/Time   HCO3 24.2 01/16/2021 1945   O2SAT 43.4 01/16/2021 1945     Coagulation Profile: No results for input(s): INR, PROTIME in the last 168 hours.  Cardiac Enzymes: No results for input(s): CKTOTAL, CKMB, CKMBINDEX, TROPONINI in the last 168 hours.  HbA1C: No results found for: HGBA1C  CBG: No results for input(s): GLUCAP in the last 168 hours.  Review of Systems:   Unable to obtain; patient on BiPAP. Information retrieved from chart and bedside nurse  Past Medical History:  She,  has a past medical history of Diabetes mellitus without complication ([place]).   Surgical History:   Past Surgical History:  Procedure Laterality Date   ABDOMINAL HYSTERECTOMY     ABDOMINAL SURGERY     bilateral tubal ligagtion     TONSILLECTOMY       Social History:    reports that she has never smoked. She has never used smokeless tobacco. She reports that she does not drink alcohol and does not use drugs.   Family History:  Her family history is not on file.   Allergies Allergies  Allergen Reactions   Sulfa Antibiotics      Home Medications  Prior to Admission medications   Medication Sig Start Date End Date Taking? Authorizing Provider  metFORMIN (GLUCOPHAGE-XR) 500 MG 24 hr tablet Take 500-1,000 mg by mouth. 1 tablet in am and 2 tablets in the evening 03/27/20  Yes [provider]  Multiple Vitamin (MULTIVITAMIN) tablet Take 1 tablet by mouth daily.   Yes [provider]  NP THYROID 60 MG tablet Take 120 mg by mouth daily. 02/28/20  Yes [provider]  pantoprazole (PROTONIX) 40 MG tablet Take 1 tablet by mouth daily. 12/21/20  Yes [provider]  VICTOZA 18 MG/3ML SOPN Inject 1.2 mg into the skin daily. 04/08/20  Yes [provider]  azithromycin (ZITHROMAX) 250 MG tablet Take 1 by mouth daily  starting on December 23 Patient not taking: Reported on 01/16/2021 07/20/11   Barbara Cower, MD     Critical care time: 71     JD Rexene Agent [facility] 01/17/2021, 12:21 AM  Please see [email or web address] for pager details.  From 7A-7P if no response, please call [phone]. After hours, please call ELink [phone].

## 2021-01-17 NOTE — Progress Notes (Signed)
Pharmacy Antibiotic Note  Christine Mcfarland is a 80 y.o. female admitted on 01/16/2021 with aspiration pneumonia.  Pharmacy has been consulted for Unasyn dosing.  Plan: Unasyn 3g IV Q6H.  Height: 5\' 3"  (160 cm) Weight: 60.5 kg (133 lb 6.1 oz) IBW/kg (Calculated) : 52.4  Temp (24hrs), Avg:98.5 F (36.9 C), Min:98.3 F (36.8 C), Max:98.6 F (37 C)  Recent Labs  Lab 01/16/21 1600  WBC 9.6  CREATININE 0.53    Estimated Creatinine Clearance: 47.2 mL/min (by C-G formula based on SCr of 0.53 mg/dL).    Allergies  Allergen Reactions   Sulfa Antibiotics      Thank you for allowing pharmacy to be a part of this patient's care.  01/18/21, PharmD, BCPS  01/17/2021 2:12 AM

## 2021-01-17 NOTE — H&P (Signed)
NAME:  Christine Mcfarland, MRN:  [id number], DOB:  [birth year], LOS: 1 ADMISSION DATE:  01/16/2021, CONSULTATION DATE:  6/21 REFERRING MD:  Dr. Jodi Mourning, CHIEF COMPLAINT:  cough and SOB   Brief History:  80 yo F w/ pertinent PMH of diabetes and GERD presents to [facility] on 6/21 with worsening cough and SOB.  Patient states her coughing / sob began shortly after taking her diabetes medication. Patient is a non-smoker and no hx of lung disease.  Found to have hypoxemia on ER assessment.  She was treated with IVF, oxygen and steroids. VBG normal.  CTA chest negative for PE, 1.6 X 0.8 cm endobronchial lesion in L mainstem bronchus vs foreign body aspiration. O2 requirements increasing and patient placed on NRB > transitioned to BiPAP. Troponin elevated to 226. EKG normal sinus rhythm. Concern for aspiration pneumonitis w/ hypoxia.   PCCM consulted for bronchoscopy, [facility] ICU admission, and management of SOB/hypoxia.  Pertinent  Medical History  DM  GERD   Significant Hospital Events: Including procedures, antibiotic start and stop dates in addition to other pertinent events   6/21: Arrived [facility] ED w/ hypoxia and SOB. CTA chest show L mainstem endobronchial lesion. PCCM consulted for transfer to [facility] ICU admission for bronchoscopy procedure. 6/22: patient transferred from [facility] to [facility] 66m ICU. On bipap 70%  Interim History / Subjective:  Afebrile / WBC 14.7 I/O 1L UOP, +342ml in last 24 hours Glucose range 112-189 On BiPAP overnight, now on 2L [place]  Pt reports she had a vigorous coughing episode with chest pain overnight and coughed up a lot of bitter tasting material.  After that she has had no further chest pain or cough.  O2 needs down to 2L.   Objective   Blood pressure 136/63, pulse 70, temperature 97.6 F (36.4 C), temperature source Oral, resp. rate (!) 26, height 5\' 3"  (1.6 m), weight 60.5 kg, SpO2 99 %.    FiO2 (%):  [40 %-70 %] 40 %   Intake/Output Summary (Last 24 hours) at 01/17/2021  0850 Last data filed at 01/17/2021 0700 Gross per 24 hour  Intake 1312.12 ml  Output 1000 ml  Net 312.12 ml   Filed Weights   01/16/21 1508 01/17/21 0100  Weight: 55 kg 60.5 kg    Examination: General: elderly adult female lying in bed in NAD, family at bedside  HEENT: MM pink/moist, [place] O2, anicteric  Neuro: AAOx4, speech clear, MAE CV: s1s2 RRR, no m/r/g PULM:  non-labored on 2L, lungs bilaterally clear GI: soft, bsx4 active  Extremities: warm/dry, no edema  Skin: no rashes or lesions  Labs/imaging that I havepersonally reviewed  (right click and "Reselect all SmartList Selections" daily)  Troponin trend - 325 > 247 >297 CBC BMP  CT Chest - ? Mucus vs aspiration material in left mainstem  CXR   Resolved Hospital Problem list     Assessment & Plan:   Acute respiratory failure w/ hypoxia Possible aspiration pneumonitis; L mainstem endobronchial lesion on CT chest -continue unasyn for empiric aspiration coverage  -wean O2 for sats >90% -Duoneb Q6 & PRN for now, may be able to deescalate in next 24 hours  -no role for further steroids -BiPAP support PRN  -plan for FOB evaluation of airways / inspection   Elevated Troponin EKG NSR; likely due to acute respiratory illness with demand ischemia  -tele monitoring  -follow troponin   Hx of DM2 -SSI, sensitive scale  -hold home metformin    Best Practice (right click  and "Reselect all SmartList Selections" daily)   Diet/type: NPO w/ oral meds Pain/Anxiety/Delirium protocol Not indicated VAP protocol (if indicated): Not indicated DVT prophylaxis: systemic heparin GI prophylaxis: PPI Glucose control:  SSI Central venous access:  N/A Arterial line:  N/A Foley:  N/A Mobility:  bed rest  PT consulted: N/A Studies pending: None Culture data pending:sputum after bronchoscopy procedure later today Last reviewed culture data:today Antibiotics:unasyn Antibiotic de-escalation: no,  continue current rx Stop date: to  be determined  Daily labs: ordered Code Status:  full code Last date of multidisciplinary goals of care discussion: n/a ccm prognosis: Serious Disposition: remains critically ill, will stay in intensive care   Critical care time:  n/a    Canary Brim, MSN, APRN, NP-C, AGACNP-BC [facility] 01/17/2021, 8:51 AM   Please see [email or web address] for pager details.   From 7A-7P if no response, please call [phone] After hours, please call ELink [phone]

## 2021-01-17 NOTE — Discharge Summary (Addendum)
Physician Discharge Summary  Patient ID: Christine Mcfarland MRN: [id number] DOB/AGE: 04/04/1941 80 y.o.  Admit date: 01/16/2021 Discharge date: 01/17/2021    Discharge Diagnoses:  Aspiration of Foreign Body (medication / pill) Acute Hypoxic Respiratory Failure Suspected Aspiration Pneumonitis  Mild Elevation of Troponin / Demand Ischemia GERD DM II                                                              DISCHARGE SUMMARY   80 y/o F who presented to [facility] on 6/21 with reports of acute cough and shortness of breath.    The patient reports she was taking her medications and got choked after taking her metformin.  She notes having a significant coughing episode after taking the pill with associated shortness of breath.  The patient was noted to have wheezing and desaturations on arrival to the high 80's.  She is a never smoker.  She was treated with [place] O2, IV fluids, nebulized bronchodilators and steroids.  VBG was normal.  CXR was without acute abnormality.  CT angio of the chest was assessed which showed concern for possible mucus vs endobronchial lesion in the left mainstem bronchus. She developed increased oxygen requirements that escalated to BiPAP support with 70% FiO2.  She continued to have a forceful non-productive cough and chest discomfort.  EKG was within normal limits.  Troponin was mildly elevated thought related to demand ischemia in the setting of respiratory distress.  She was started on unasyn for empiric aspiration coverage and transferred to [facility] for further evaluation. She was admitted to the ICU on BiPAP.  Glucose control was achieved with SSI.  Overnight into 6/22 the patient had an additional vigorous coughing episode where she coughed up a large volume of bitter tasting secretions.  On 6/22, she underwent inspection bronchoscopy with a small amount of residual yellow pill pieces identified in the left upper airway.  She coughed and cleared residual during the  bronchoscopy.  No endobronchial lesions identified.  The patient was recovered in ICU.  The patient was critically ill on 6/21 and it was initially felt she would require greater than 2 nights in the hospital to improve to a point where discharge could be considered. However, she was medically cleared 6/22 for discharge with plans as below.       DISCHARGE PLAN BY DIAGNOSIS      Aspiration of Foreign Body (medication / pill) Acute Hypoxic Respiratory Failure Suspected Aspiration Pneumonitis   Discharge Plan: -aspiration precautions -no further follow up at this time -patient instructed if she develops new or worsening symptoms to report to the ER   Mild Elevation of Troponin / Demand Ischemia  -EKG negative, troponin flat. No acute follow up at this time.   GERD  Discharge Plan: -pt instructed to elevate HOB  -continue PPI  DM II  Discharge Plan: -resume home metformin, victoza                          KEY EVENTS 6/21 Admit with cough, SOB.  Suspected aspiration event after taking evening pills 6/22 FOB with residual pill identified in airway, cleared with coughing during bronchoscopy  SIGNIFICANT DIAGNOSTIC STUDIES 6/21 CTA Chest >> no acute intrathoracic pathology identified, no PE.  1.6x0.8 cm enhancing endobronchial lesion in the left mainstem bronchus  MICRO DATA  Influenza A/B 6/21 >> negative  MRSA PCR 6/22 >> negative    Discharge Exam: General: adult female, appears younger than stated age, lying in bed in NAD  Neuro: AAOx4, speech clear, MAE  CV: s1s2 rrr, no m/r/g PULM: non-labored on RA, lungs bilaterally clear  WF:UXNA, non-tender, bsx4 active  Extremities: warm/dry, no edema   Vitals:   01/17/21 1000 01/17/21 1100 01/17/21 1119 01/17/21 1200  BP: (!) 140/59 (!) 133/53  (!) 100/59  Pulse: 69 79  79  Resp: 20 (!) 21  16  Temp:   97.6 F (36.4 C)   TempSrc:   Oral   SpO2: 96% 96%  95%  Weight:      Height:         Discharge  Labs  BMET Recent Labs  Lab 01/16/21 1600 01/16/21 2317 01/17/21 0145  NA 132* 132*  --   K 3.9 3.9  --   CL 98 99  --   CO2 26 24  --   GLUCOSE 132* 138*  --   BUN 11 9  --   CREATININE 0.53 0.37* 0.34*  CALCIUM 8.8* 8.4*  --   MG  --   --  1.7    CBC Recent Labs  Lab 01/16/21 1600 01/17/21 0145  HGB 13.2 12.6  HCT 40.5 38.4  WBC 9.6 14.7*  PLT 298 318       Follow-up Information     Bryson Corona, NP Follow up in 1 week(s).   Specialty: Family Medicine Why: Call for hospital follow up to be seen in one week. Contact information: [place] [address] [phone]                  Allergies as of 01/17/2021       Reactions   Sulfa Antibiotics         Medication List     STOP taking these medications    azithromycin 250 MG tablet Commonly known as: ZITHROMAX       TAKE these medications    metFORMIN 500 MG 24 hr tablet Commonly known as: GLUCOPHAGE-XR Take 500-1,000 mg by mouth. 1 tablet in am and 2 tablets in the evening   multivitamin tablet Take 1 tablet by mouth daily.   NP Thyroid 60 MG tablet Generic drug: thyroid Take 120 mg by mouth daily.   pantoprazole 40 MG tablet Commonly known as: PROTONIX Take 1 tablet by mouth daily.   Victoza 18 MG/3ML Sopn Generic drug: liraglutide Inject 1.2 mg into the skin daily.          Disposition: Home. No new home health needs identified.   Discharged Condition: Christine Mcfarland has met maximum benefit of inpatient care and is medically stable and cleared for discharge.  Patient is pending follow up as above.      Time spent on disposition:  Greater than 35 Minutes.     Signed: Noe Gens, MSN, APRN, NP-C, AGACNP-BC [facility] 01/17/2021, 2:06 PM   Please see [email or web address] for pager details.

## 2021-01-17 NOTE — Progress Notes (Signed)
Daughter, Toniann Fail, called for update post bronchoscopy.  Message left for return call.        Canary Brim, MSN, APRN, NP-C, AGACNP-BC [facility] 01/17/2021, 12:06 PM   Please see [email or web address] for pager details.   From 7A-7P if no response, please call [phone] After hours, please call ELink [phone]

## 2021-01-17 NOTE — Progress Notes (Signed)
Pt placed on heated HFNC per NP. Pt on 15L 50% at this time resting comfortably, vitals stable. RT will continue to monitor throughout night.

## 2021-01-17 NOTE — Procedures (Signed)
Bronchoscopy Procedure Note  Christine Mcfarland  [id number]  [birth year]  Date:01/17/21  Time:1:28 PM   Provider Performing:Brent Anthon [place]   Procedure(s):  Flexible Bronchoscopy [id number])  Indication(s) Foreign body aspiration into airway  Consent Risks of the procedure as well as the alternatives and risks of each were explained to the patient and/or caregiver.  Consent for the procedure was obtained and is signed in the bedside chart  Anesthesia Versed 11m, Fentanyl 531m IV   Time Out Verified patient identification, verified procedure, site/side was marked, verified correct patient position, special equipment/implants available, medications/allergies/relevant history reviewed, required imaging and test results available.   Sterile Technique Usual hand hygiene, masks, gowns, and gloves were used   Procedure Description Bronchoscope advanced through nares and into airway.  Airways were examined down to subsegmental level with findings noted below.   Following diagnostic evaluation, the scope was removed.  Findings: There was a minor amount of bleeding in the posterior pharynx from the insertion of the bronchoscope through the right nare.  The larynx was normal in function and appearance.  The trachea was normal in caliber and shape, the carina was sharp.  The right tracheobronchial tree was normal in appearance without secretions or mucosal abnormality.  There was erythema and edema in the left mainstem bronchus with small yellow flecks of an apparent foreign body.  However the airway was completely patent in the left lung without subsegmental occlusion visible from the bronchoscope.     Complications/Tolerance None; patient tolerated the procedure well. Chest X-ray is not needed post procedure.   EBL Minimal   Specimen(s) None  BrRoselie AwkwardMD [place]villeCCM Pager: [phone]: ([phone] no response, please call [phone] until 7pm After 7:00 pm call Elink   [phone]

## 2021-01-17 NOTE — Plan of Care (Signed)

## 2021-01-17 NOTE — Progress Notes (Signed)
eLink Physician-Brief Progress Note Patient Name: Christine Mcfarland DOB: [birth year] MRN: [id number]   Date of Service  01/17/2021  HPI/Events of Note  Troponin = 226 --> 325 --> 247. EKG reveals NSR with rate = 87. Demand ischemia?  eICU Interventions  Plan: Will update PCCM ground team of results.  Continue present management.     Intervention Category Major Interventions: Other:  Nester Bachus Dennard Nip 01/17/2021, 4:12 AM

## 2021-01-17 NOTE — Progress Notes (Signed)
Patient discharged home with daughter Toniann Fail from [facility]. AVS reviewed and patient understands information. All PIVs removed and next dose medications also reviewed.

## 2021-04-11 DIAGNOSIS — D510 Vitamin B12 deficiency anemia due to intrinsic factor deficiency: Secondary | ICD-10-CM | POA: Diagnosis not present

## 2021-04-11 DIAGNOSIS — R5382 Chronic fatigue, unspecified: Secondary | ICD-10-CM | POA: Diagnosis not present

## 2021-04-11 DIAGNOSIS — R799 Abnormal finding of blood chemistry, unspecified: Secondary | ICD-10-CM | POA: Diagnosis not present

## 2021-04-11 DIAGNOSIS — D529 Folate deficiency anemia, unspecified: Secondary | ICD-10-CM | POA: Diagnosis not present

## 2021-04-11 DIAGNOSIS — E559 Vitamin D deficiency, unspecified: Secondary | ICD-10-CM | POA: Diagnosis not present

## 2021-04-11 DIAGNOSIS — E039 Hypothyroidism, unspecified: Secondary | ICD-10-CM | POA: Diagnosis not present

## 2021-04-11 DIAGNOSIS — D649 Anemia, unspecified: Secondary | ICD-10-CM | POA: Diagnosis not present

## 2021-11-26 IMAGING — CT CT ANGIO CHEST
2 of 6 series · 18 of 46 positions shown · IV contrast (omnipaque)
Comparison: Chest radiograph dated 01/16/2021.

CLINICAL DATA: 80-year-old female with chest pain and shortness of
breath.

EXAM:
CT ANGIOGRAPHY CHEST WITH CONTRAST
TECHNIQUE: Multidetector CT imaging of the chest was performed using the
standard protocol during bolus administration of intravenous
contrast. Multiplanar CT image reconstructions and MIPs were
obtained to evaluate the vascular anatomy.
CONTRAST:  75mL OMNIPAQUE IOHEXOL 350 MG/ML SOLN

[Series 5: pe axial thins · axial · 0.66mm/px · z∈[-521,-264]mm · 15 of 351 slices shown]
[im 15/351  lung]
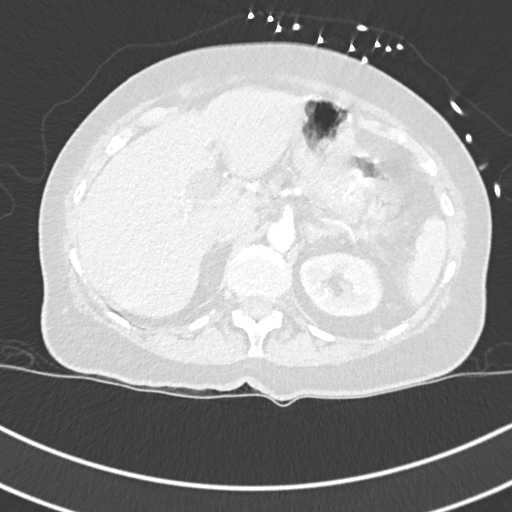
[im 44/351  soft-tissue]
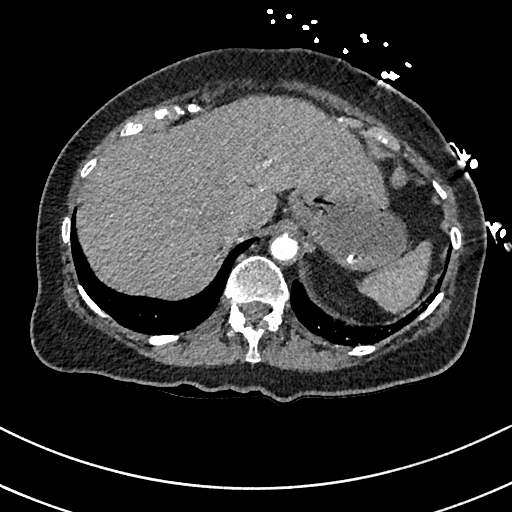
[im 59/351  lung]
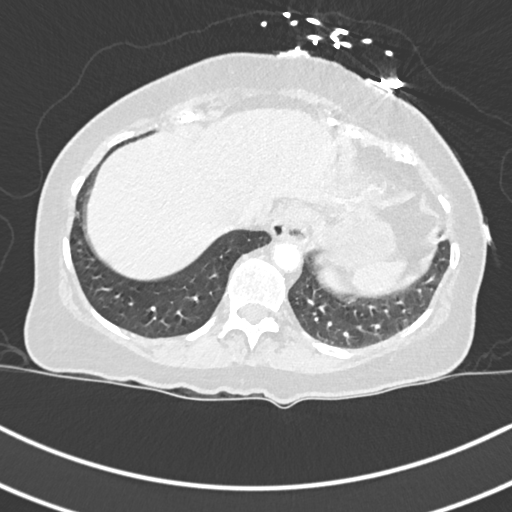
[im 88/351  soft-tissue]
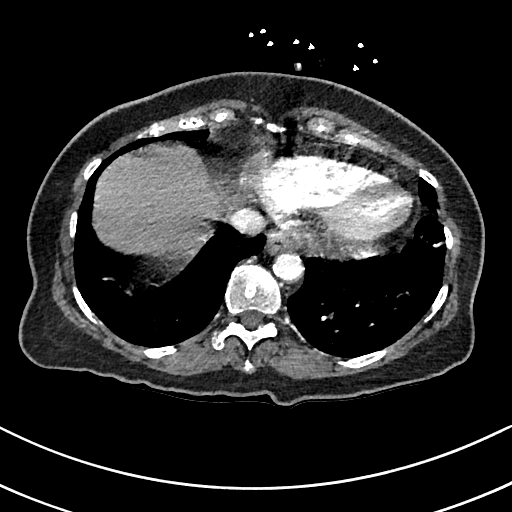
[im 103/351  lung]
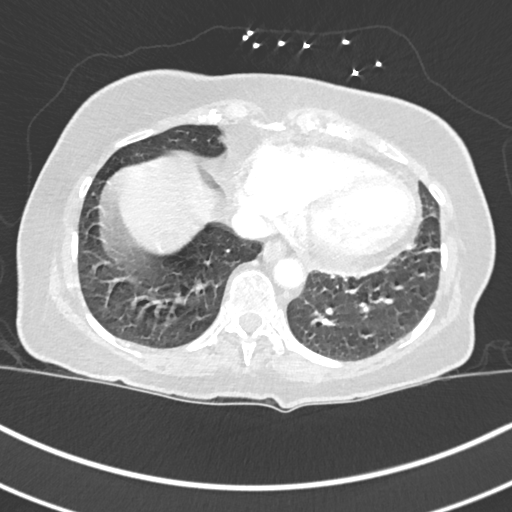
[im 132/351  soft-tissue]
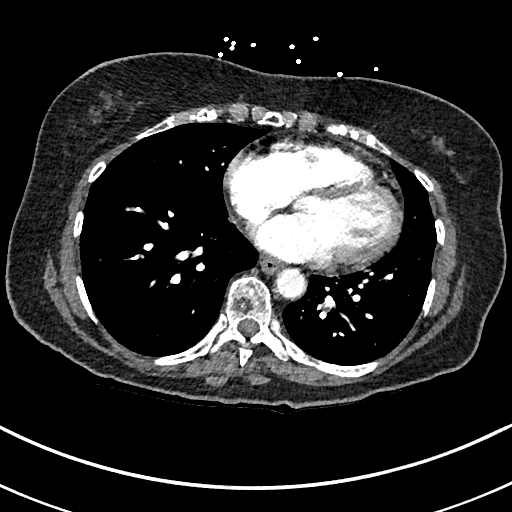
[im 146/351  lung]
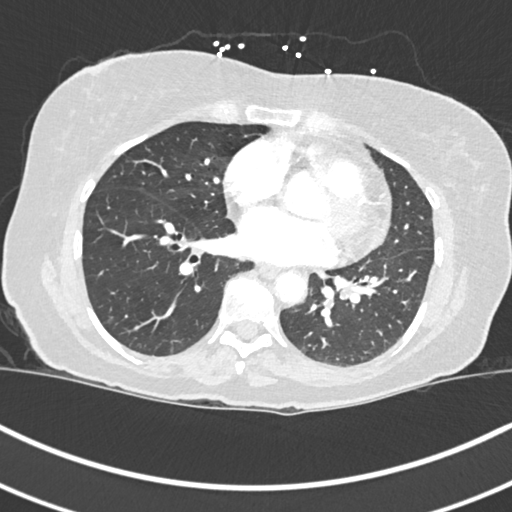
[im 176/351  soft-tissue]
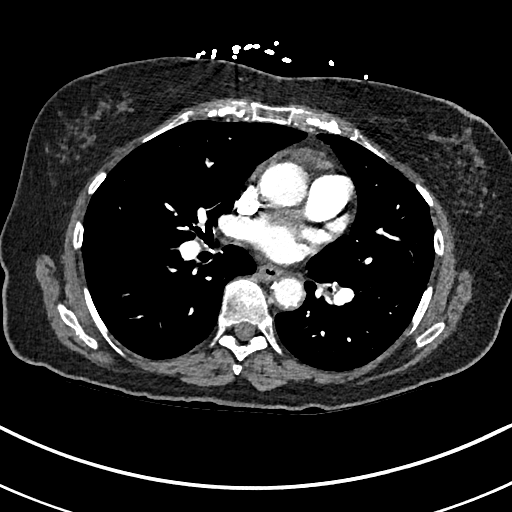
[im 205/351  lung]
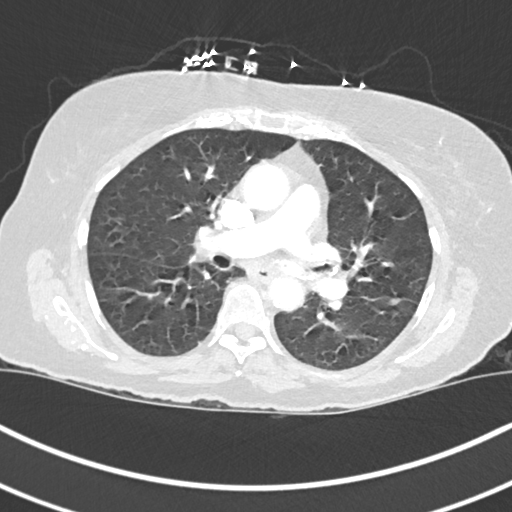
[im 219/351  soft-tissue]
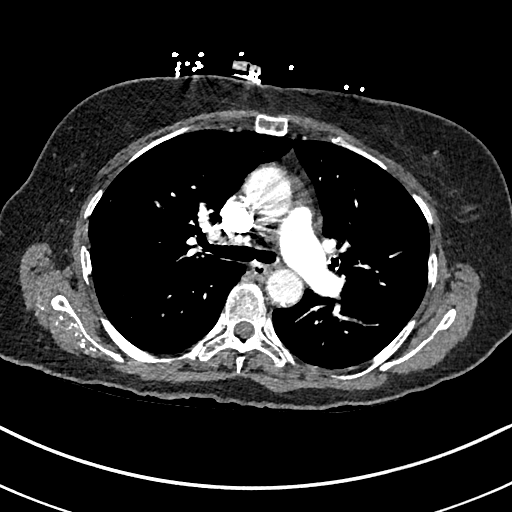
[im 248/351  lung]
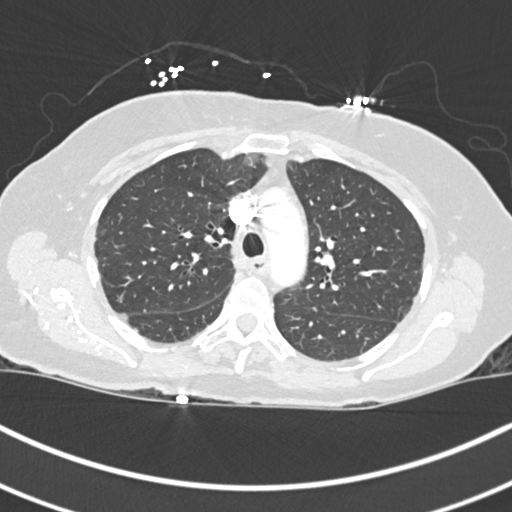
[im 263/351  soft-tissue]
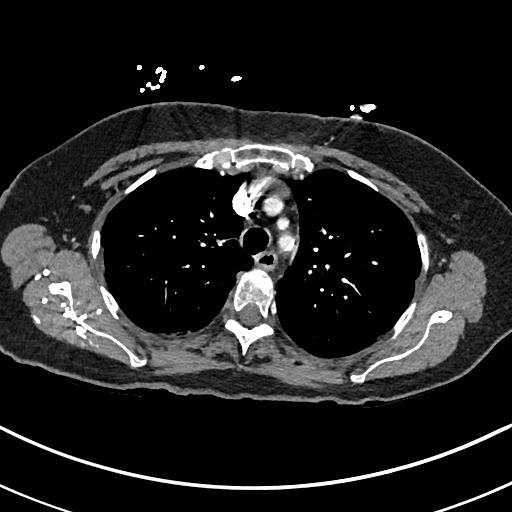
[im 292/351  lung]
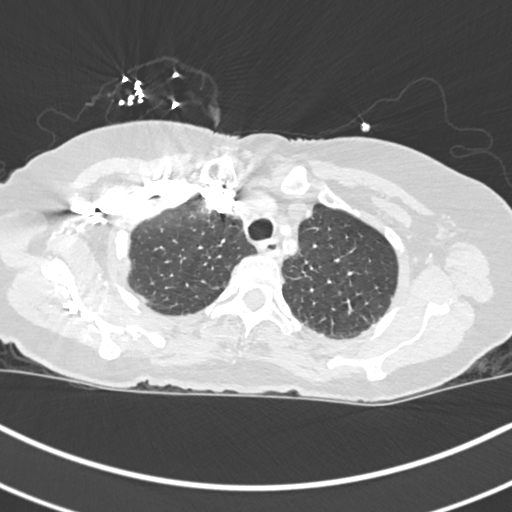
[im 307/351  soft-tissue]
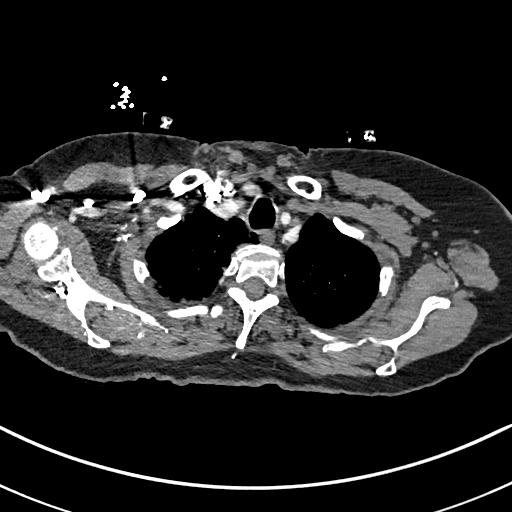
[im 336/351  lung]
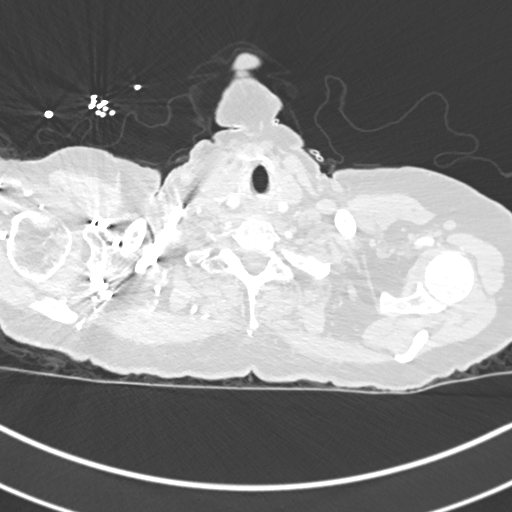

[Series 7: cor soft · coronal · 0.57mm/px · 3 of 139 slices shown]
[im 35/139  soft-tissue]
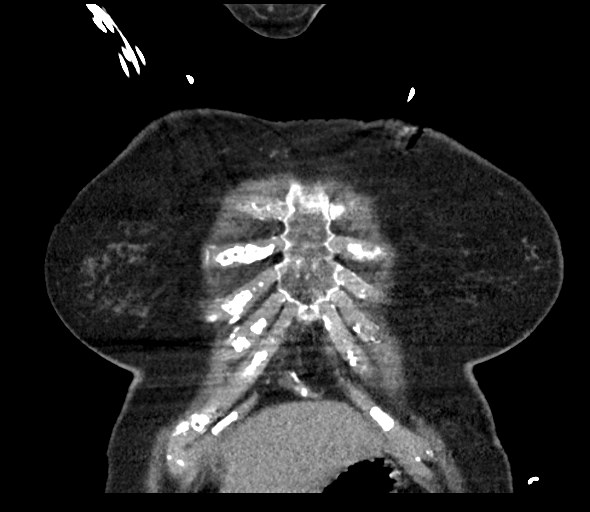
[im 70/139  soft-tissue]
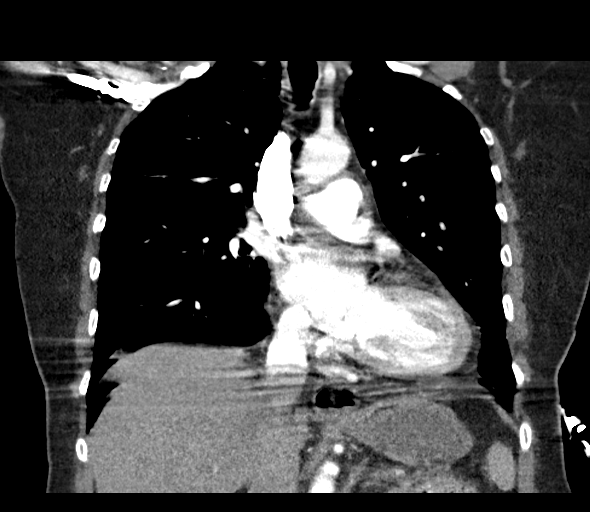
[im 104/139  soft-tissue]
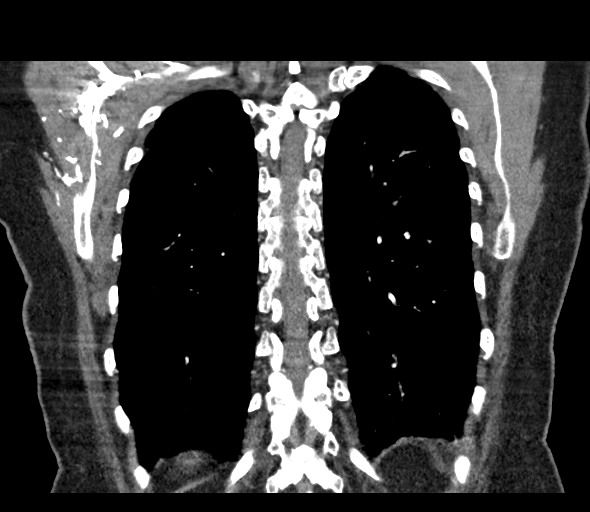

[18 of 46 positions shown; findings below may reference images not displayed]

FINDINGS: Evaluation of this exam is limited due to respiratory motion
artifact.

Cardiovascular: Top-normal cardiac size. No pericardial effusion.
Moderate atherosclerotic calcification of the thoracic aorta. No
aneurysmal dilatation or dissection. The origins of the great
vessels of the aortic arch appear patent as visualized. No pulmonary
artery embolus identified.

Mediastinum/Nodes: There is no hilar or mediastinal adenopathy.
Small hiatal hernia. The esophagus is grossly unremarkable. No
mediastinal fluid collection.

Lungs/Pleura: Left lung base linear atelectasis/scarring. No focal
consolidation, pleural effusion, or pneumothorax. Biapical
subpleural scarring. There is mucous impaction in the left lower
lobe bronchi. A 1.6 x 0.8 cm high attenuating ovoid structure in the
region of the left mainstem bronchus (41/4) is concerning for an
enhancing in the bronchial lesion. Further evaluation with
bronchoscopy is recommended. The central airways remain patent.

Upper Abdomen: Cholecystectomy and dilated common bile duct.

Musculoskeletal: Osteopenia with degenerative changes of the spine.
No acute osseous pathology.

Review of the MIP images confirms the above findings.
IMPRESSION: 1. No acute intrathoracic pathology. No CT evidence of pulmonary
artery embolus.
2. A 1.6 x 0.8 cm probable enhancing endobronchial lesion in the
left mainstem bronchus. Further evaluation with bronchoscopy is
recommended.
3. Aortic Atherosclerosis ([id number]-TS8.8).

## 2022-01-18 ENCOUNTER — Other Ambulatory Visit (HOSPITAL_COMMUNITY): Payer: Self-pay | Admitting: Family Medicine

## 2022-01-18 DIAGNOSIS — Z1231 Encounter for screening mammogram for malignant neoplasm of breast: Secondary | ICD-10-CM

## 2022-01-28 ENCOUNTER — Ambulatory Visit (HOSPITAL_COMMUNITY)
Admission: RE | Admit: 2022-01-28 | Discharge: 2022-01-28 | Disposition: A | Discharge status: Home / Self Care | Payer: Medicare Other | Source: Ambulatory Visit | Attending: Family Medicine | Admitting: Family Medicine

## 2022-01-28 DIAGNOSIS — Z1231 Encounter for screening mammogram for malignant neoplasm of breast: Secondary | ICD-10-CM | POA: Diagnosis present

## 2023-02-18 ENCOUNTER — Other Ambulatory Visit (HOSPITAL_COMMUNITY): Payer: Self-pay | Admitting: Family Medicine

## 2023-02-18 DIAGNOSIS — Z1231 Encounter for screening mammogram for malignant neoplasm of breast: Secondary | ICD-10-CM

## 2023-02-26 ENCOUNTER — Ambulatory Visit (HOSPITAL_COMMUNITY)
Admission: RE | Admit: 2023-02-26 | Discharge: 2023-02-26 | Disposition: A | Discharge status: Home / Self Care | Payer: Medicare Other | Source: Ambulatory Visit | Attending: Family Medicine | Admitting: Family Medicine

## 2023-02-26 DIAGNOSIS — Z1231 Encounter for screening mammogram for malignant neoplasm of breast: Secondary | ICD-10-CM | POA: Diagnosis present

## 2023-02-27 ENCOUNTER — Encounter (HOSPITAL_COMMUNITY): Payer: Self-pay | Admitting: Family Medicine

## 2023-03-03 ENCOUNTER — Other Ambulatory Visit (HOSPITAL_COMMUNITY): Payer: Self-pay | Admitting: Family Medicine

## 2023-03-03 DIAGNOSIS — R928 Other abnormal and inconclusive findings on diagnostic imaging of breast: Secondary | ICD-10-CM

## 2023-03-04 ENCOUNTER — Ambulatory Visit (HOSPITAL_COMMUNITY)
Admission: RE | Admit: 2023-03-04 | Discharge: 2023-03-04 | Disposition: A | Discharge status: Home / Self Care | Payer: Medicare Other | Source: Ambulatory Visit | Attending: Family Medicine | Admitting: Family Medicine

## 2023-03-04 DIAGNOSIS — R928 Other abnormal and inconclusive findings on diagnostic imaging of breast: Secondary | ICD-10-CM | POA: Insufficient documentation

## 2023-10-27 DIAGNOSIS — R5382 Chronic fatigue, unspecified: Secondary | ICD-10-CM | POA: Diagnosis not present

## 2023-10-27 DIAGNOSIS — E7800 Pure hypercholesterolemia, unspecified: Secondary | ICD-10-CM | POA: Diagnosis not present

## 2023-10-27 DIAGNOSIS — R799 Abnormal finding of blood chemistry, unspecified: Secondary | ICD-10-CM | POA: Diagnosis not present

## 2023-10-27 DIAGNOSIS — E559 Vitamin D deficiency, unspecified: Secondary | ICD-10-CM | POA: Diagnosis not present

## 2024-03-11 ENCOUNTER — Other Ambulatory Visit (HOSPITAL_COMMUNITY): Payer: Self-pay | Admitting: Family Medicine

## 2024-03-11 DIAGNOSIS — Z1231 Encounter for screening mammogram for malignant neoplasm of breast: Secondary | ICD-10-CM

## 2024-03-23 DIAGNOSIS — N3001 Acute cystitis with hematuria: Secondary | ICD-10-CM | POA: Diagnosis not present

## 2024-03-23 DIAGNOSIS — R300 Dysuria: Secondary | ICD-10-CM | POA: Diagnosis not present

## 2024-03-25 ENCOUNTER — Ambulatory Visit (HOSPITAL_COMMUNITY)
Admission: RE | Admit: 2024-03-25 | Discharge: 2024-03-25 | Disposition: A | Discharge status: Home / Self Care | Source: Ambulatory Visit | Attending: Family Medicine | Admitting: Family Medicine

## 2024-03-25 ENCOUNTER — Encounter (HOSPITAL_COMMUNITY): Payer: Self-pay

## 2024-03-25 DIAGNOSIS — Z1231 Encounter for screening mammogram for malignant neoplasm of breast: Secondary | ICD-10-CM | POA: Diagnosis not present

## 2024-03-31 ENCOUNTER — Encounter (HOSPITAL_COMMUNITY): Payer: Self-pay | Admitting: Family Medicine

## 2024-04-01 ENCOUNTER — Other Ambulatory Visit (HOSPITAL_COMMUNITY): Payer: Self-pay | Admitting: Family Medicine

## 2024-04-01 DIAGNOSIS — R928 Other abnormal and inconclusive findings on diagnostic imaging of breast: Secondary | ICD-10-CM

## 2024-04-06 ENCOUNTER — Encounter (HOSPITAL_COMMUNITY): Payer: Self-pay

## 2024-04-06 ENCOUNTER — Other Ambulatory Visit (HOSPITAL_COMMUNITY): Payer: Self-pay | Admitting: Family Medicine

## 2024-04-06 ENCOUNTER — Inpatient Hospital Stay (HOSPITAL_COMMUNITY)
Admission: RE | Admit: 2024-04-06 | Discharge: 2024-04-06 | Discharge status: Home / Self Care | Source: Ambulatory Visit | Attending: Family Medicine | Admitting: Family Medicine

## 2024-04-06 ENCOUNTER — Ambulatory Visit (HOSPITAL_COMMUNITY)
Admission: RE | Admit: 2024-04-06 | Discharge: 2024-04-06 | Disposition: A | Discharge status: Home / Self Care | Source: Ambulatory Visit | Attending: Family Medicine | Admitting: Family Medicine

## 2024-04-06 DIAGNOSIS — R928 Other abnormal and inconclusive findings on diagnostic imaging of breast: Secondary | ICD-10-CM | POA: Diagnosis not present

## 2024-04-06 DIAGNOSIS — R921 Mammographic calcification found on diagnostic imaging of breast: Secondary | ICD-10-CM

## 2024-04-06 DIAGNOSIS — R92333 Mammographic heterogeneous density, bilateral breasts: Secondary | ICD-10-CM | POA: Diagnosis not present

## 2024-04-15 ENCOUNTER — Ambulatory Visit
Admission: RE | Admit: 2024-04-15 | Discharge: 2024-04-15 | Disposition: A | Discharge status: Home / Self Care | Source: Ambulatory Visit | Attending: Family Medicine | Admitting: Family Medicine

## 2024-04-15 DIAGNOSIS — R921 Mammographic calcification found on diagnostic imaging of breast: Secondary | ICD-10-CM

## 2024-04-15 DIAGNOSIS — N6011 Diffuse cystic mastopathy of right breast: Secondary | ICD-10-CM | POA: Diagnosis not present

## 2024-04-15 HISTORY — PX: BREAST BIOPSY: SHX20

## 2024-04-16 LAB — SURGICAL PATHOLOGY

## 2024-04-27 ENCOUNTER — Other Ambulatory Visit: Payer: Self-pay | Admitting: Surgery

## 2024-04-27 DIAGNOSIS — R92331 Mammographic heterogeneous density, right breast: Secondary | ICD-10-CM | POA: Diagnosis not present

## 2024-04-27 DIAGNOSIS — N6091 Unspecified benign mammary dysplasia of right breast: Secondary | ICD-10-CM

## 2024-04-27 DIAGNOSIS — N6081 Other benign mammary dysplasias of right breast: Secondary | ICD-10-CM | POA: Diagnosis not present

## 2024-08-13 ENCOUNTER — Other Ambulatory Visit: Payer: Self-pay | Admitting: Nurse Practitioner

## 2024-08-13 DIAGNOSIS — N6310 Unspecified lump in the right breast, unspecified quadrant: Secondary | ICD-10-CM

## 2024-12-16 ENCOUNTER — Ambulatory Visit: Admitting: Neurology
